# Patient Record
Sex: Female | Born: 1980 | Hispanic: No | Marital: Married | Smoking: Never smoker
Health system: Southern US, Community
[De-identification: ages and names within clinical notes are randomized; demographics above are authoritative.]

## PROBLEM LIST (undated history)

## (undated) DIAGNOSIS — K219 Gastro-esophageal reflux disease without esophagitis: Secondary | ICD-10-CM

## (undated) DIAGNOSIS — F32A Depression, unspecified: Secondary | ICD-10-CM

## (undated) DIAGNOSIS — F329 Major depressive disorder, single episode, unspecified: Secondary | ICD-10-CM

## (undated) DIAGNOSIS — K37 Unspecified appendicitis: Secondary | ICD-10-CM

## (undated) DIAGNOSIS — D649 Anemia, unspecified: Secondary | ICD-10-CM

## (undated) HISTORY — PX: APPENDECTOMY: SHX54

---

## 2011-01-31 ENCOUNTER — Other Ambulatory Visit: Payer: Self-pay | Admitting: Family Medicine

## 2011-01-31 DIAGNOSIS — Z3689 Encounter for other specified antenatal screening: Secondary | ICD-10-CM

## 2011-01-31 LAB — ABO/RH

## 2011-01-31 LAB — CBC
HCT: 33 % — AB (ref 36–46)
Hemoglobin: 11.6 g/dL — AB (ref 12.0–16.0)

## 2011-01-31 LAB — RPR: RPR: NONREACTIVE

## 2011-02-01 ENCOUNTER — Ambulatory Visit (HOSPITAL_COMMUNITY)
Admission: RE | Admit: 2011-02-01 | Discharge: 2011-02-01 | Disposition: A | Discharge status: Home / Self Care | Payer: Medicaid Other | Source: Ambulatory Visit | Attending: Family Medicine | Admitting: Family Medicine

## 2011-02-01 ENCOUNTER — Other Ambulatory Visit: Payer: Self-pay | Admitting: Family Medicine

## 2011-02-01 DIAGNOSIS — Z3689 Encounter for other specified antenatal screening: Secondary | ICD-10-CM

## 2011-02-01 DIAGNOSIS — O358XX0 Maternal care for other (suspected) fetal abnormality and damage, not applicable or unspecified: Secondary | ICD-10-CM | POA: Insufficient documentation

## 2011-02-01 DIAGNOSIS — Z363 Encounter for antenatal screening for malformations: Secondary | ICD-10-CM | POA: Insufficient documentation

## 2011-02-01 DIAGNOSIS — Z1389 Encounter for screening for other disorder: Secondary | ICD-10-CM | POA: Insufficient documentation

## 2011-02-22 ENCOUNTER — Other Ambulatory Visit: Payer: Self-pay | Admitting: Family Medicine

## 2011-02-26 ENCOUNTER — Ambulatory Visit (HOSPITAL_COMMUNITY)
Admission: RE | Admit: 2011-02-26 | Discharge: 2011-02-26 | Disposition: A | Discharge status: Home / Self Care | Payer: Medicaid Other | Source: Ambulatory Visit | Attending: Family Medicine | Admitting: Family Medicine

## 2011-02-26 DIAGNOSIS — O365990 Maternal care for other known or suspected poor fetal growth, unspecified trimester, not applicable or unspecified: Secondary | ICD-10-CM | POA: Insufficient documentation

## 2011-02-26 DIAGNOSIS — Z3689 Encounter for other specified antenatal screening: Secondary | ICD-10-CM | POA: Insufficient documentation

## 2011-05-17 ENCOUNTER — Other Ambulatory Visit: Payer: Self-pay | Admitting: Family Medicine

## 2011-05-17 DIAGNOSIS — R319 Hematuria, unspecified: Secondary | ICD-10-CM

## 2011-05-21 ENCOUNTER — Ambulatory Visit (HOSPITAL_COMMUNITY)
Admission: RE | Admit: 2011-05-21 | Discharge: 2011-05-21 | Disposition: A | Discharge status: Home / Self Care | Payer: Medicaid Other | Source: Ambulatory Visit | Attending: Family Medicine | Admitting: Family Medicine

## 2011-05-21 DIAGNOSIS — O99891 Other specified diseases and conditions complicating pregnancy: Secondary | ICD-10-CM | POA: Insufficient documentation

## 2011-05-21 DIAGNOSIS — R319 Hematuria, unspecified: Secondary | ICD-10-CM | POA: Insufficient documentation

## 2011-07-11 ENCOUNTER — Encounter (HOSPITAL_COMMUNITY): Payer: Self-pay | Admitting: *Deleted

## 2011-07-11 ENCOUNTER — Encounter (HOSPITAL_COMMUNITY): Payer: Self-pay | Admitting: Anesthesiology

## 2011-07-11 ENCOUNTER — Inpatient Hospital Stay (HOSPITAL_COMMUNITY): Payer: Medicaid Other | Admitting: Anesthesiology

## 2011-07-11 ENCOUNTER — Encounter (HOSPITAL_COMMUNITY): Payer: Self-pay

## 2011-07-11 ENCOUNTER — Inpatient Hospital Stay (HOSPITAL_COMMUNITY)
Admission: AD | Admit: 2011-07-11 | Discharge: 2011-07-15 | DRG: 766 | Disposition: A | Discharge status: Home / Self Care | Payer: Medicaid Other | Source: Ambulatory Visit | Attending: Obstetrics and Gynecology | Admitting: Obstetrics and Gynecology

## 2011-07-11 ENCOUNTER — Inpatient Hospital Stay (HOSPITAL_COMMUNITY)
Admission: AD | Admit: 2011-07-11 | Discharge: 2011-07-11 | Disposition: A | Discharge status: Home / Self Care | Payer: Medicaid Other | Source: Ambulatory Visit | Attending: Obstetrics and Gynecology | Admitting: Obstetrics and Gynecology

## 2011-07-11 DIAGNOSIS — O479 False labor, unspecified: Secondary | ICD-10-CM | POA: Insufficient documentation

## 2011-07-11 DIAGNOSIS — O4700 False labor before 37 completed weeks of gestation, unspecified trimester: Secondary | ICD-10-CM

## 2011-07-11 DIAGNOSIS — O34219 Maternal care for unspecified type scar from previous cesarean delivery: Secondary | ICD-10-CM | POA: Diagnosis present

## 2011-07-11 HISTORY — DX: Anemia, unspecified: D64.9

## 2011-07-11 HISTORY — DX: Major depressive disorder, single episode, unspecified: F32.9

## 2011-07-11 HISTORY — DX: Unspecified appendicitis: K37

## 2011-07-11 HISTORY — DX: Gastro-esophageal reflux disease without esophagitis: K21.9

## 2011-07-11 HISTORY — DX: Depression, unspecified: F32.A

## 2011-07-11 LAB — CBC
HCT: 38.8 % (ref 36.0–46.0)
Hemoglobin: 12.6 g/dL (ref 12.0–15.0)
RBC: 4.44 MIL/uL (ref 3.87–5.11)
WBC: 11.1 10*3/uL — ABNORMAL HIGH (ref 4.0–10.5)

## 2011-07-11 MED ORDER — EPHEDRINE 5 MG/ML INJ
10.0000 mg | INTRAVENOUS | Status: DC | PRN
  Filled 2011-07-11: qty 4

## 2011-07-11 MED ORDER — FENTANYL 2.5 MCG/ML BUPIVACAINE 1/10 % EPIDURAL INFUSION (WH - ANES)
INTRAMUSCULAR | Status: AC
  Filled 2011-07-11: qty 60

## 2011-07-11 MED ORDER — CITRIC ACID-SODIUM CITRATE 334-500 MG/5ML PO SOLN
30.0000 mL | ORAL | Status: DC | PRN
  Administered 2011-07-12: 30 mL via ORAL
  Filled 2011-07-11: qty 15

## 2011-07-11 MED ORDER — NALBUPHINE HCL 10 MG/ML IJ SOLN: 10.0000 mg | INTRAMUSCULAR | Status: DC | PRN

## 2011-07-11 MED ORDER — LACTATED RINGERS IV SOLN
500.0000 mL | INTRAVENOUS | Status: DC | PRN
  Administered 2011-07-11: 500 mL via INTRAVENOUS

## 2011-07-11 MED ORDER — ACETAMINOPHEN 325 MG PO TABS: 650.0000 mg | ORAL_TABLET | ORAL | Status: DC | PRN

## 2011-07-11 MED ORDER — OXYCODONE-ACETAMINOPHEN 5-325 MG PO TABS
2.0000 | ORAL_TABLET | ORAL | Status: DC | PRN
  Filled 2011-07-11 (×3): qty 2
  Filled 2011-07-11: qty 1
  Filled 2011-07-11: qty 2
  Filled 2011-07-11 (×2): qty 1
  Filled 2011-07-11 (×2): qty 2

## 2011-07-11 MED ORDER — LIDOCAINE HCL 1.5 % IJ SOLN
INTRAMUSCULAR | Status: DC | PRN
  Administered 2011-07-11 (×2): 5 mL via EPIDURAL

## 2011-07-11 MED ORDER — OXYTOCIN 20 UNITS IN LACTATED RINGERS INFUSION - SIMPLE: 125.0000 mL/h | INTRAVENOUS | Status: AC

## 2011-07-11 MED ORDER — ZOLPIDEM TARTRATE 10 MG PO TABS: 10.0000 mg | ORAL_TABLET | Freq: Every evening | ORAL | Status: DC | PRN

## 2011-07-11 MED ORDER — OXYTOCIN BOLUS FROM INFUSION
500.0000 mL | Freq: Once | INTRAVENOUS | Status: DC
  Filled 2011-07-11: qty 500
  Filled 2011-07-11: qty 1000

## 2011-07-11 MED ORDER — ONDANSETRON HCL 4 MG/2ML IJ SOLN: 4.0000 mg | Freq: Four times a day (QID) | INTRAMUSCULAR | Status: DC | PRN

## 2011-07-11 MED ORDER — FENTANYL 2.5 MCG/ML BUPIVACAINE 1/10 % EPIDURAL INFUSION (WH - ANES)
INTRAMUSCULAR | Status: DC | PRN
  Administered 2011-07-11: 14 mL/h via EPIDURAL

## 2011-07-11 MED ORDER — PHENYLEPHRINE 40 MCG/ML (10ML) SYRINGE FOR IV PUSH (FOR BLOOD PRESSURE SUPPORT)
PREFILLED_SYRINGE | INTRAVENOUS | Status: AC
  Filled 2011-07-11: qty 5

## 2011-07-11 MED ORDER — IBUPROFEN 600 MG PO TABS
600.0000 mg | ORAL_TABLET | Freq: Four times a day (QID) | ORAL | Status: DC | PRN
  Filled 2011-07-11 (×9): qty 1

## 2011-07-11 MED ORDER — LIDOCAINE HCL (PF) 1 % IJ SOLN
30.0000 mL | INTRAMUSCULAR | Status: DC | PRN
  Filled 2011-07-11 (×2): qty 30

## 2011-07-11 MED ORDER — NALBUPHINE SYRINGE 5 MG/0.5 ML
10.0000 mg | INJECTION | INTRAMUSCULAR | Status: DC | PRN
  Administered 2011-07-11: 10 mg via INTRAVENOUS
  Filled 2011-07-11 (×2): qty 1

## 2011-07-11 MED ORDER — EPHEDRINE 5 MG/ML INJ
10.0000 mg | INTRAVENOUS | Status: DC | PRN
  Filled 2011-07-11 (×2): qty 4

## 2011-07-11 MED ORDER — OXYTOCIN 20 UNITS IN LACTATED RINGERS INFUSION - SIMPLE
1.0000 m[IU]/min | INTRAVENOUS | Status: DC
  Administered 2011-07-11: 2 m[IU]/min via INTRAVENOUS

## 2011-07-11 MED ORDER — EPHEDRINE 5 MG/ML INJ
INTRAVENOUS | Status: AC
  Filled 2011-07-11: qty 4

## 2011-07-11 MED ORDER — FENTANYL 2.5 MCG/ML BUPIVACAINE 1/10 % EPIDURAL INFUSION (WH - ANES)
14.0000 mL/h | INTRAMUSCULAR | Status: DC
  Administered 2011-07-11 – 2011-07-12 (×2): 14 mL/h via EPIDURAL
  Filled 2011-07-11 (×2): qty 60

## 2011-07-11 MED ORDER — LACTATED RINGERS IV SOLN: 500.0000 mL | Freq: Once | INTRAVENOUS | Status: DC

## 2011-07-11 MED ORDER — DIPHENHYDRAMINE HCL 50 MG/ML IJ SOLN
12.5000 mg | INTRAMUSCULAR | Status: DC | PRN
  Filled 2011-07-11 (×2): qty 1

## 2011-07-11 MED ORDER — TERBUTALINE SULFATE 1 MG/ML IJ SOLN: 0.2500 mg | Freq: Once | INTRAMUSCULAR | Status: AC | PRN

## 2011-07-11 MED ORDER — PHENYLEPHRINE 40 MCG/ML (10ML) SYRINGE FOR IV PUSH (FOR BLOOD PRESSURE SUPPORT)
80.0000 ug | PREFILLED_SYRINGE | INTRAVENOUS | Status: DC | PRN
  Filled 2011-07-11: qty 5

## 2011-07-11 MED ORDER — FLEET ENEMA 7-19 GM/118ML RE ENEM: 1.0000 | ENEMA | RECTAL | Status: DC | PRN

## 2011-07-11 MED ORDER — LACTATED RINGERS IV SOLN
INTRAVENOUS | Status: DC
  Administered 2011-07-11 – 2011-07-12 (×4): via INTRAVENOUS

## 2011-07-11 NOTE — Anesthesia Preprocedure Evaluation (Signed)
Anesthesia Evaluation  Name, MR# and DOB Patient awake  General Assessment Comment  Reviewed: Allergy & Precautions, H&P , Patient's Chart, lab work & pertinent test results  Airway Mallampati: I TM Distance: >3 FB Neck ROM: full    Dental No notable dental hx.    Pulmonary  clear to auscultation  pulmonary exam normalPulmonary Exam Normal breath sounds clear to auscultation none    Cardiovascular     Neuro/Psych    (+) Depression,  Negative Neurological ROS    GI/Hepatic/Renal   negative Liver ROS  negative Renal ROS   GERD      Endo/Other  Negative Endocrine ROS (+)      Abdominal Normal abdominal exam  (+)   Musculoskeletal   Hematology negative hematology ROS (+)   Peds  Reproductive/Obstetrics (+) Pregnancy    Anesthesia Other Findings             Anesthesia Physical Anesthesia Plan  ASA: II  Anesthesia Plan: Epidural   Post-op Pain Management:    Induction:   Airway Management Planned:   Additional Equipment:   Intra-op Plan:   Post-operative Plan:   Informed Consent: I have reviewed the patients History and Physical, chart, labs and discussed the procedure including the risks, benefits and alternatives for the proposed anesthesia with the patient or authorized representative who has indicated his/her understanding and acceptance.     Plan Discussed with:   Anesthesia Plan Comments:         Anesthesia Quick Evaluation

## 2011-07-11 NOTE — Progress Notes (Signed)
Theresa Avery is a 30 y.o. G2P1001 at [redacted]w[redacted]d by LMP admitted for active labor  Subjective:   Objective: BP 93/58  Pulse 91  Temp(Src) 97.8 F (36.6 C) (Oral)  Resp 22  Ht 5' (1.524 m)  Wt 64.411 kg (142 lb)  BMI 27.73 kg/m2  SpO2 100%      FHT:  FHR: 130 bpm, variability: moderate,  accelerations:  Present,  decelerations:  Absent UC:   regular, every 6 minutes; slowed down after epidural and fluid bolus SVE:   8/70/-2.  AROM with light mec fluid  Labs: Lab Results  Component Value Date   WBC 11.1* 07/11/2011   HGB 12.6 07/11/2011   HCT 38.8 07/11/2011   MCV 87.4 07/11/2011   PLT 185 07/11/2011    Assessment / Plan: Spontaneous labor, progressing normally  Labor: Progressing normally Preeclampsia:   Fetal Wellbeing:  Category I Pain Control:  Epidural I/D:   Anticipated MOD:  NSVD  CRESENZO-DISHMAN,FRANCES 07/11/2011, 7:17 PM

## 2011-07-11 NOTE — Anesthesia Procedure Notes (Addendum)
Epidural Patient location during procedure: OB Start time: 07/11/2011 6:35 PM End time: 07/11/2011 6:44 PM Reason for block: procedure for pain  Staffing Anesthesiologist: Sandrea Hughs Performed by: anesthesiologist   Preanesthetic Checklist Completed: patient identified, site marked, surgical consent, pre-op evaluation, timeout performed, IV checked, risks and benefits discussed and monitors and equipment checked  Epidural Patient position: sitting Prep: site prepped and draped and DuraPrep Patient monitoring: continuous pulse ox and blood pressure Approach: midline Injection technique: LOR air  Needle:  Needle type: Tuohy  Needle gauge: 17 G Needle length: 9 cm Needle insertion depth: 4 cm Catheter type: closed end flexible Catheter size: 19 Gauge Catheter at skin depth: 9 cm Test dose: negative and 1.5% lidocaine  Assessment Events: blood not aspirated, injection not painful, no injection resistance, negative IV test and no paresthesia

## 2011-07-11 NOTE — Progress Notes (Signed)
Theresa Avery is a 30 y.o. G2P1001 at [redacted]w[redacted]d by ultrasound admitted for active labor  Subjective:   Objective: BP 86/58  Pulse 85  Temp(Src) 98.1 F (36.7 C) (Oral)  Resp 14  Ht 5' (1.524 m)  Wt 64.411 kg (142 lb)  BMI 27.73 kg/m2  SpO2 100%   I/O this shift: In: -  Out: 550 [Urine:550]  FHT:  FHR: 140 bpm, variability: moderate,  accelerations:  Present,  decelerations:  Present occ mild variable decel UC:   irregular, every 3-5  minutes SVE:   8/90/-1 by Joyce Copa, CNM  Labs: Lab Results  Component Value Date   WBC 11.1* 07/11/2011   HGB 12.6 07/11/2011   HCT 38.8 07/11/2011   MCV 87.4 07/11/2011   PLT 185 07/11/2011    Assessment / Plan: Protracted active phase; will continue to increase Pitocin.  IUPC placed  Labor: on 4 mu/min of Pitocin Preeclampsia:   Fetal Wellbeing:  Category II Pain Control:  Epidural I/D:   Anticipated MOD:  NSVD  CRESENZO-DISHMAN,FRANCES 07/11/2011, 10:43 PM

## 2011-07-11 NOTE — Progress Notes (Signed)
Pt states she had some black vaginal discharge and is having contractions every 10-15 minutes. Reports good fetal movement, no leaking.

## 2011-07-11 NOTE — Progress Notes (Signed)
  Theresa Avery is a 30 y.o. G2P1001 at [redacted]w[redacted]d admitted for active labor  Subjective: Pt resting comfortably.  Husband at bedside.  Low dose pitocin started on 2110 for inadequate contraction pattern.    Objective: BP 80/49  Pulse 83  Temp(Src) 98 F (36.7 C) (Oral)  Resp 14  Ht 5' (1.524 m)  Wt 142 lb (64.411 kg)  BMI 27.73 kg/m2  SpO2 100%      FHT:  FHR: 130 bpm, variability: moderate,  accelerations:  Present,  decelerations:  Present variables UC:   regular, every 3-4 minutes SVE:   Dilation: 8 Effacement (%): 80 Station: -2 Exam by:: Lilli Few, RN  Labs: Lab Results  Component Value Date   WBC 11.1* 07/11/2011   HGB 12.6 07/11/2011   HCT 38.8 07/11/2011   MCV 87.4 07/11/2011   PLT 185 07/11/2011    Assessment / Plan: Augmentation of labor, progressing well  Labor: started low dose pitocin; will increase cautiously as this is a TOLAC Fetal Wellbeing:  Category II Pain Control:  Epidural I/D:  n/a Anticipated MOD:  NSVD  [name], [name] 07/11/2011, 9:35 PM

## 2011-07-11 NOTE — ED Provider Notes (Signed)
History     Chief Complaint  Patient presents with   Contractions   HPI Comments: Pt. Presents today at [redacted]w[redacted]d with contractions and dark discharge from the vagina.  Contractions started earlier today and are irregular, between 8-15 minutes apart.  No LOF.  Fetal movement is present.   OB History    Grav Para Term Preterm Abortions TAB SAB Ect Mult Living   2 1 1  0 0 0 0 0 0 1      Past Medical History  Diagnosis Date   Malaria    Depression    Appendicitis     Did not have surgery    Past Surgical History  Procedure Date   Cesarean section     No family history on file.  History  Substance Use Topics   Smoking status: Never Smoker    Smokeless tobacco: Never Used   Alcohol Use: No    Allergies: No Known Allergies  Prescriptions prior to admission  Medication Sig Dispense Refill   amoxicillin (AMOXIL) 500 MG capsule Take 500 mg by mouth 3 (three) times daily. Patient had dental work done        prenatal vitamin w/FE, FA (PRENATAL 1 + 1) 27-1 MG TABS Take 1 tablet by mouth daily.          Review of Systems  Eyes: Negative for blurred vision.  Neurological: Negative for dizziness.   Physical Exam   Blood pressure 131/80, pulse 81, temperature 98.3 F (36.8 C), temperature source Oral, resp. rate 16, height 4' 9.5" (1.461 m), weight 69.491 kg (153 lb 3.2 oz), SpO2 99.00%.  Physical Exam  Constitutional: She appears well-developed and well-nourished.  Respiratory: Effort normal.  GI: Soft. She exhibits no distension.  Genitourinary: Vagina normal. No vaginal discharge found.       Cervix: 3/50/-2    MAU Course  Procedures  Assessment and Plan  D/C home with information about active labor. Instructed to return when active labor has begun. Pt. Was seen and discussed with Dr. Esmeralda Arthur Durland 07/11/2011, 9:13 AM

## 2011-07-11 NOTE — H&P (Signed)
Theresa Avery is a 30 y.o. female presenting for active labor at [redacted]w[redacted]d, no none ROM. Maternal Medical History:  Reason for admission: Reason for admission: contractions and vaginal bleeding.  Contractions: Onset was 13-24 hours ago.   Frequency: regular.   Perceived severity is strong.    Fetal activity: Perceived fetal activity is normal.      OB History    Grav Para Term Preterm Abortions TAB SAB Ect Mult Living   2 1 1  0 0 0 0 0 0 1     Past Medical History  Diagnosis Date   Malaria    Depression    Appendicitis     Did not have surgery   Past Surgical History  Procedure Date   Cesarean section    Family History: family history is not on file. Social History:  reports that she has never smoked. She has never used smokeless tobacco. She reports that she does not drink alcohol or use illicit drugs.  Review of Systems  Constitutional: Negative for fever.  Eyes: Negative for blurred vision.  Respiratory: Negative for shortness of breath.   Cardiovascular: Negative for chest pain.  Neurological: Negative for headaches.    Dilation: 7 Effacement (%): 80 Station: -1 Exam by:: Dr Natale Milch Blood pressure 125/85, pulse 98, temperature 97.8 F (36.6 C), temperature source Oral, resp. rate 22, SpO2 100.00%. Maternal Exam:  Uterine Assessment: Contraction duration is 30 seconds. Contraction frequency is regular.   Abdomen: Fetal presentation: vertex  Cervix: Cervix evaluated by digital exam.     Fetal Exam Fetal Monitor Review: Baseline rate: 140.  Variability: moderate (6-25 bpm).   Pattern: accelerations present and no decelerations.    Fetal State Assessment: Category I - tracings are normal.     Physical Exam  Constitutional: She is oriented to person, place, and time. She appears well-developed and well-nourished.  Cardiovascular: Normal rate, regular rhythm and normal heart sounds.   No murmur heard. Respiratory: Effort normal and breath sounds normal.  No respiratory distress. She has no wheezes.  Genitourinary:       Cervix: 7/80/-1  Neurological: She is alert and oriented to person, place, and time.    Prenatal labs: ABO, Rh: B/Positive/-- (03/21 0000) Antibody: Negative (03/21 0000) Rubella:  Nonimmune RPR: Nonreactive (03/21 0000)  HBsAg: Negative (03/21 0000)  HIV: Non-reactive (03/21 0000)  GBS: Negative (08/02 0000)   Assessment/Plan: Active labor Admit to L/D where vaginal delivery is expected. Previous c-section, has signed TOLAC consent. Pt. Would like an epidural upon request. Pt. Was discussed and seen with Dr. Natale Milch.  Katie Durland 07/11/2011, 4:59 PM

## 2011-07-11 NOTE — Progress Notes (Signed)
[employer] # [phone] for all education and admission questions.

## 2011-07-12 ENCOUNTER — Other Ambulatory Visit: Payer: Self-pay | Admitting: Obstetrics & Gynecology

## 2011-07-12 ENCOUNTER — Encounter (HOSPITAL_COMMUNITY): Payer: Self-pay | Admitting: *Deleted

## 2011-07-12 ENCOUNTER — Encounter (HOSPITAL_COMMUNITY)
Admission: AD | Disposition: A | Discharge status: Home / Self Care | Payer: Self-pay | Source: Ambulatory Visit | Attending: Obstetrics and Gynecology

## 2011-07-12 DIAGNOSIS — O34219 Maternal care for unspecified type scar from previous cesarean delivery: Secondary | ICD-10-CM

## 2011-07-12 SURGERY — Surgical Case
Anesthesia: Regional

## 2011-07-12 MED ORDER — MORPHINE SULFATE (PF) 0.5 MG/ML IJ SOLN
INTRAMUSCULAR | Status: DC | PRN
  Administered 2011-07-12: 2 mg via EPIDURAL

## 2011-07-12 MED ORDER — OXYTOCIN 20 UNITS IN LACTATED RINGERS INFUSION - SIMPLE
INTRAVENOUS | Status: DC | PRN
  Administered 2011-07-12: 40 [IU] via INTRAVENOUS

## 2011-07-12 MED ORDER — KETOROLAC TROMETHAMINE 60 MG/2ML IM SOLN
60.0000 mg | Freq: Once | INTRAMUSCULAR | Status: AC | PRN
  Administered 2011-07-12: 60 mg via INTRAMUSCULAR

## 2011-07-12 MED ORDER — SODIUM CHLORIDE 0.9 % IJ SOLN: 3.0000 mL | INTRAMUSCULAR | Status: DC | PRN

## 2011-07-12 MED ORDER — DIBUCAINE 1 % RE OINT: 1.0000 "application " | TOPICAL_OINTMENT | RECTAL | Status: DC | PRN

## 2011-07-12 MED ORDER — DIPHENHYDRAMINE HCL 50 MG/ML IJ SOLN
12.5000 mg | INTRAMUSCULAR | Status: DC | PRN
  Administered 2011-07-12: 12.5 mg via INTRAVENOUS

## 2011-07-12 MED ORDER — ONDANSETRON HCL 4 MG/2ML IJ SOLN
INTRAMUSCULAR | Status: AC
  Filled 2011-07-12: qty 2

## 2011-07-12 MED ORDER — SIMETHICONE 80 MG PO CHEW
80.0000 mg | CHEWABLE_TABLET | Freq: Three times a day (TID) | ORAL | Status: DC
  Administered 2011-07-12 – 2011-07-15 (×11): 80 mg via ORAL

## 2011-07-12 MED ORDER — METHYLERGONOVINE MALEATE 0.2 MG/ML IJ SOLN
INTRAMUSCULAR | Status: DC | PRN
  Administered 2011-07-12: 1 mL via INTRAMUSCULAR

## 2011-07-12 MED ORDER — ONDANSETRON HCL 4 MG/2ML IJ SOLN: 4.0000 mg | INTRAMUSCULAR | Status: DC | PRN

## 2011-07-12 MED ORDER — WITCH HAZEL-GLYCERIN EX PADS: 1.0000 "application " | MEDICATED_PAD | CUTANEOUS | Status: DC | PRN

## 2011-07-12 MED ORDER — IBUPROFEN 600 MG PO TABS: 600.0000 mg | ORAL_TABLET | Freq: Four times a day (QID) | ORAL | Status: DC | PRN

## 2011-07-12 MED ORDER — SCOPOLAMINE 1 MG/3DAYS TD PT72
1.0000 | MEDICATED_PATCH | Freq: Once | TRANSDERMAL | Status: AC
  Administered 2011-07-12: 1.5 mg via TRANSDERMAL

## 2011-07-12 MED ORDER — OXYTOCIN 10 UNIT/ML IJ SOLN
INTRAMUSCULAR | Status: AC
  Filled 2011-07-12: qty 2

## 2011-07-12 MED ORDER — IBUPROFEN 600 MG PO TABS
600.0000 mg | ORAL_TABLET | Freq: Four times a day (QID) | ORAL | Status: DC
  Administered 2011-07-12 – 2011-07-15 (×12): 600 mg via ORAL
  Filled 2011-07-12 (×2): qty 1

## 2011-07-12 MED ORDER — KETOROLAC TROMETHAMINE 30 MG/ML IJ SOLN: 15.0000 mg | Freq: Once | INTRAMUSCULAR | Status: DC | PRN

## 2011-07-12 MED ORDER — TETANUS-DIPHTH-ACELL PERTUSSIS 5-2.5-18.5 LF-MCG/0.5 IM SUSP
0.5000 mL | Freq: Once | INTRAMUSCULAR | Status: AC
  Administered 2011-07-13: 0.5 mL via INTRAMUSCULAR
  Filled 2011-07-12: qty 0.5

## 2011-07-12 MED ORDER — DIPHENHYDRAMINE HCL 25 MG PO CAPS: 25.0000 mg | ORAL_CAPSULE | Freq: Four times a day (QID) | ORAL | Status: DC | PRN

## 2011-07-12 MED ORDER — ONDANSETRON HCL 4 MG/2ML IJ SOLN: 4.0000 mg | Freq: Once | INTRAMUSCULAR | Status: DC | PRN

## 2011-07-12 MED ORDER — HYDROMORPHONE HCL 1 MG/ML IJ SOLN: 0.2500 mg | INTRAMUSCULAR | Status: DC | PRN

## 2011-07-12 MED ORDER — ZOLPIDEM TARTRATE 5 MG PO TABS: 5.0000 mg | ORAL_TABLET | Freq: Every evening | ORAL | Status: DC | PRN

## 2011-07-12 MED ORDER — NALBUPHINE HCL 10 MG/ML IJ SOLN
5.0000 mg | INTRAMUSCULAR | Status: DC | PRN
  Filled 2011-07-12: qty 1

## 2011-07-12 MED ORDER — SIMETHICONE 80 MG PO CHEW: 80.0000 mg | CHEWABLE_TABLET | ORAL | Status: DC | PRN

## 2011-07-12 MED ORDER — OXYTOCIN 20 UNITS IN LACTATED RINGERS INFUSION - SIMPLE
INTRAVENOUS | Status: AC
  Filled 2011-07-12: qty 1000

## 2011-07-12 MED ORDER — AMOXICILLIN 500 MG PO CAPS
500.0000 mg | ORAL_CAPSULE | Freq: Three times a day (TID) | ORAL | Status: DC
  Administered 2011-07-12 – 2011-07-15 (×10): 500 mg via ORAL
  Filled 2011-07-12 (×13): qty 1

## 2011-07-12 MED ORDER — SCOPOLAMINE 1 MG/3DAYS TD PT72
MEDICATED_PATCH | TRANSDERMAL | Status: AC
  Filled 2011-07-12: qty 1

## 2011-07-12 MED ORDER — MORPHINE SULFATE (PF) 0.5 MG/ML IJ SOLN
INTRAMUSCULAR | Status: DC | PRN
  Administered 2011-07-12: 3 mg via EPIDURAL

## 2011-07-12 MED ORDER — NALOXONE HCL 0.4 MG/ML IJ SOLN: 0.4000 mg | INTRAMUSCULAR | Status: DC | PRN

## 2011-07-12 MED ORDER — METHYLERGONOVINE MALEATE 0.2 MG/ML IJ SOLN: 0.2000 mg | INTRAMUSCULAR | Status: DC | PRN

## 2011-07-12 MED ORDER — OXYCODONE-ACETAMINOPHEN 5-325 MG PO TABS
1.0000 | ORAL_TABLET | ORAL | Status: DC | PRN
  Administered 2011-07-13 – 2011-07-15 (×5): 1 via ORAL

## 2011-07-12 MED ORDER — SENNOSIDES-DOCUSATE SODIUM 8.6-50 MG PO TABS
2.0000 | ORAL_TABLET | Freq: Every day | ORAL | Status: DC
  Administered 2011-07-12: 1 via ORAL
  Administered 2011-07-13 – 2011-07-14 (×2): 2 via ORAL

## 2011-07-12 MED ORDER — CEFAZOLIN SODIUM 1-5 GM-% IV SOLN
INTRAVENOUS | Status: AC
  Filled 2011-07-12: qty 50

## 2011-07-12 MED ORDER — MEPERIDINE HCL 25 MG/ML IJ SOLN: 6.2500 mg | INTRAMUSCULAR | Status: DC | PRN

## 2011-07-12 MED ORDER — MORPHINE SULFATE 0.5 MG/ML IJ SOLN
INTRAMUSCULAR | Status: AC
  Filled 2011-07-12: qty 10

## 2011-07-12 MED ORDER — SODIUM BICARBONATE 8.4 % IV SOLN
INTRAVENOUS | Status: DC | PRN
  Administered 2011-07-12: 10 mL via EPIDURAL

## 2011-07-12 MED ORDER — KETOROLAC TROMETHAMINE 30 MG/ML IJ SOLN: 30.0000 mg | Freq: Four times a day (QID) | INTRAMUSCULAR | Status: AC | PRN

## 2011-07-12 MED ORDER — MENTHOL 3 MG MT LOZG: 1.0000 | LOZENGE | OROMUCOSAL | Status: DC | PRN

## 2011-07-12 MED ORDER — ONDANSETRON HCL 4 MG/2ML IJ SOLN
INTRAMUSCULAR | Status: DC | PRN
  Administered 2011-07-12: 4 mg via INTRAVENOUS

## 2011-07-12 MED ORDER — CEFAZOLIN SODIUM 1-5 GM-% IV SOLN
INTRAVENOUS | Status: DC | PRN
  Administered 2011-07-12: 1 g via INTRAVENOUS

## 2011-07-12 MED ORDER — PHENYLEPHRINE 40 MCG/ML (10ML) SYRINGE FOR IV PUSH (FOR BLOOD PRESSURE SUPPORT)
PREFILLED_SYRINGE | INTRAVENOUS | Status: AC
  Filled 2011-07-12: qty 5

## 2011-07-12 MED ORDER — EPHEDRINE 5 MG/ML INJ
INTRAVENOUS | Status: AC
  Filled 2011-07-12: qty 10

## 2011-07-12 MED ORDER — DIPHENHYDRAMINE HCL 25 MG PO CAPS: 25.0000 mg | ORAL_CAPSULE | ORAL | Status: DC | PRN

## 2011-07-12 MED ORDER — SODIUM CHLORIDE 0.9 % IV SOLN
1.0000 ug/kg/h | INTRAVENOUS | Status: DC | PRN
  Filled 2011-07-12: qty 2.5

## 2011-07-12 MED ORDER — LANOLIN HYDROUS EX OINT: 1.0000 "application " | TOPICAL_OINTMENT | CUTANEOUS | Status: DC | PRN

## 2011-07-12 MED ORDER — DIPHENHYDRAMINE HCL 50 MG/ML IJ SOLN: 25.0000 mg | INTRAMUSCULAR | Status: DC | PRN

## 2011-07-12 MED ORDER — METHYLERGONOVINE MALEATE 0.2 MG PO TABS: 0.2000 mg | ORAL_TABLET | ORAL | Status: DC | PRN

## 2011-07-12 MED ORDER — LACTATED RINGERS IV SOLN: INTRAVENOUS | Status: DC

## 2011-07-12 MED ORDER — KETOROLAC TROMETHAMINE 60 MG/2ML IM SOLN
INTRAMUSCULAR | Status: AC
  Filled 2011-07-12: qty 2

## 2011-07-12 MED ORDER — ONDANSETRON HCL 4 MG/2ML IJ SOLN: 4.0000 mg | Freq: Three times a day (TID) | INTRAMUSCULAR | Status: DC | PRN

## 2011-07-12 MED ORDER — PHENYLEPHRINE HCL 10 MG/ML IJ SOLN
INTRAMUSCULAR | Status: DC | PRN
  Administered 2011-07-12 (×3): 40 ug via INTRAVENOUS

## 2011-07-12 MED ORDER — PRENATAL PLUS 27-1 MG PO TABS
1.0000 | ORAL_TABLET | Freq: Every day | ORAL | Status: DC
  Administered 2011-07-12 – 2011-07-15 (×4): 1 via ORAL
  Filled 2011-07-12 (×4): qty 1

## 2011-07-12 MED ORDER — METHYLERGONOVINE MALEATE 0.2 MG/ML IJ SOLN
INTRAMUSCULAR | Status: DC | PRN
  Administered 2011-07-12: 0.2 mg via INTRAMUSCULAR

## 2011-07-12 MED ORDER — ONDANSETRON HCL 4 MG PO TABS: 4.0000 mg | ORAL_TABLET | ORAL | Status: DC | PRN

## 2011-07-12 MED ORDER — KETOROLAC TROMETHAMINE 30 MG/ML IJ SOLN
30.0000 mg | Freq: Four times a day (QID) | INTRAMUSCULAR | Status: AC | PRN
  Administered 2011-07-12: 30 mg via INTRAVENOUS
  Filled 2011-07-12: qty 1

## 2011-07-12 MED ORDER — OXYTOCIN 20 UNITS IN LACTATED RINGERS INFUSION - SIMPLE
125.0000 mL/h | INTRAVENOUS | Status: AC
  Administered 2011-07-12: 125 mL/h via INTRAVENOUS

## 2011-07-12 SURGICAL SUPPLY — 29 items
CLOTH BEACON ORANGE TIMEOUT ST (SAFETY) ×2 IMPLANT
DERMABOND ADVANCED (GAUZE/BANDAGES/DRESSINGS) ×4 IMPLANT
DURAPREP 26ML APPLICATOR (WOUND CARE) ×4 IMPLANT
ELECT REM PT RETURN 9FT ADLT (ELECTROSURGICAL) ×2
ELECTRODE REM PT RTRN 9FT ADLT (ELECTROSURGICAL) ×1 IMPLANT
EXTRACTOR VACUUM BELL STYLE (SUCTIONS) ×2 IMPLANT
GLOVE BIOGEL PI IND STRL 8 (GLOVE) ×2 IMPLANT
GLOVE BIOGEL PI INDICATOR 8 (GLOVE) ×2
GLOVE ECLIPSE 8.0 STRL XLNG CF (GLOVE) ×4 IMPLANT
GOWN STRL REIN XL XLG (GOWN DISPOSABLE) ×4 IMPLANT
KIT ABG SYR 3ML LUER SLIP (SYRINGE) IMPLANT
NEEDLE HYPO 25X5/8 SAFETYGLIDE (NEEDLE) IMPLANT
NS IRRIG 1000ML POUR BTL (IV SOLUTION) ×2 IMPLANT
PACK C SECTION WH (CUSTOM PROCEDURE TRAY) ×2 IMPLANT
RTRCTR C-SECT PINK 25CM LRG (MISCELLANEOUS) ×2 IMPLANT
SLEEVE SCD COMPRESS KNEE MED (MISCELLANEOUS) ×2 IMPLANT
STAPLER VISISTAT 35W (STAPLE) IMPLANT
SUT CHROMIC 0 CT 1 (SUTURE) ×2 IMPLANT
SUT MNCRL 0 VIOLET CTX 36 (SUTURE) ×2 IMPLANT
SUT MONOCRYL 0 CTX 36 (SUTURE) ×2
SUT PLAIN 2 0 (SUTURE)
SUT PLAIN 2 0 XLH (SUTURE) IMPLANT
SUT PLAIN ABS 2-0 CT1 27XMFL (SUTURE) IMPLANT
SUT VIC AB 0 CTX 36 (SUTURE) ×1
SUT VIC AB 0 CTX36XBRD ANBCTRL (SUTURE) ×1 IMPLANT
SUT VIC AB 4-0 KS 27 (SUTURE) ×2 IMPLANT
TOWEL OR 17X24 6PK STRL BLUE (TOWEL DISPOSABLE) ×4 IMPLANT
TRAY FOLEY CATH 14FR (SET/KITS/TRAYS/PACK) IMPLANT
WATER STERILE IRR 1000ML POUR (IV SOLUTION) IMPLANT

## 2011-07-12 NOTE — Anesthesia Postprocedure Evaluation (Deleted)
Anesthesia Post Note  Patient: Theresa Avery  Procedure(s) Performed:  CESAREAN SECTION  Anesthesia type: Spinal  Patient location: PACU  Post pain: Pain level controlled  Post assessment: Post-op Vital signs reviewed  Last Vitals:  Filed Vitals:   07/12/11 0545  BP: 100/59  Pulse: 83  Temp:   Resp: 20    Post vital signs: Reviewed  Level of consciousness: awake  Complications: No apparent anesthesia complications

## 2011-07-12 NOTE — Anesthesia Postprocedure Evaluation (Signed)
Anesthesia Post Note  Patient: Theresa Avery  Procedure(s) Performed:  CESAREAN SECTION  Anesthesia type: Regional  Patient location: Mother/Baby  Post pain: Pain level controlled  Post assessment: Post-op Vital signs reviewed and Patient's Cardiovascular Status Stable  Last Vitals:  Filed Vitals:   07/12/11 0753  BP: 99/67  Pulse: 81  Temp: 99.2 F (37.3 C)  Resp: 20    Post vital signs: Reviewed and stable  Level of consciousness: awake, alert  and oriented  Complications: No apparent anesthesia complications

## 2011-07-12 NOTE — Addendum Note (Signed)
Addendum  created 07/12/11 0900 by Randa Spike   Modules edited:Notes Section

## 2011-07-12 NOTE — Progress Notes (Signed)
Theresa Avery is a 30 y.o. G2P1001 at [redacted]w[redacted]d by ultrasound admitted for active labor  Subjective:   Objective: BP 106/69  Pulse 85  Temp(Src) 98 F (36.7 C) (Oral)  Resp 16  Ht 5' (1.524 m)  Wt 64.411 kg (142 lb)  BMI 27.73 kg/m2  SpO2 100%   I/O this shift: In: -  Out: 550 [Urine:550]  FHT:  FHR: 140 bpm, variability: moderate,  accelerations:  Present,  decelerations:  Present mild variables on occasion UC:   MVU's ~ SVE:   8-9/thick on right side and anterior lip/-1  Labs: Lab Results  Component Value Date   WBC 11.1* 07/11/2011   HGB 12.6 07/11/2011   HCT 38.8 07/11/2011   MCV 87.4 07/11/2011   PLT 185 07/11/2011    Assessment / Plan: Protracted active phase  Labor: Dr. Despina Hidden notified of pt status.  WIll continue present management for now and recheck in 2 hours Preeclampsia:   Fetal Wellbeing:  Category II Pain Control:  Epidural I/D:   Anticipated MOD:  NSVD  CRESENZO-DISHMAN,FRANCES 07/12/2011, 1:40 AM

## 2011-07-12 NOTE — Transfer of Care (Signed)
  Anesthesia Post-op Note  Patient: Theresa Avery  Procedure(s) Performed:  CESAREAN SECTION  Patient Location: PACU  Anesthesia Type: Epidural  Level of Consciousness: awake, alert  and oriented  Airway and Oxygen Therapy: Patient Spontanous Breathing  Post-op Pain: none  Post-op Assessment: Post-op Vital signs reviewed and Patient's Cardiovascular Status Stable  Post-op Vital Signs: Reviewed and stable  Complications: No apparent anesthesia complications

## 2011-07-12 NOTE — Progress Notes (Signed)
[employer] on phone with pt and Drenda Freeze, [place] and Dr. Despina Hidden discussing need for C/S. Pt agrees and consents for C/S.

## 2011-07-12 NOTE — Progress Notes (Signed)
UR chart review completed.  

## 2011-07-12 NOTE — Progress Notes (Signed)
Theresa Avery is a 30 y.o. G2P1001 at [redacted]w[redacted]d admitted for active labor  Subjective: Comfortable with epidural.  Contractions at 180-190 mon[place] units.  Objective: BP 98/64  Pulse 81  Temp(Src) 98.1 F (36.7 C) (Oral)  Resp 14  Ht 5' (1.524 m)  Wt 142 lb (64.411 kg)  BMI 27.73 kg/m2  SpO2 100%   I/O this shift: In: -  Out: 550 [Urine:550]  FHT:  FHR: 135 bpm, variability: moderate,  accelerations:  Present,  decelerations:  Present occasional mild variables UC:   irregular, every 2-4 minutes SVE:   9/90/-1  Labs: Lab Results  Component Value Date   WBC 11.1* 07/11/2011   HGB 12.6 07/11/2011   HCT 38.8 07/11/2011   MCV 87.4 07/11/2011   PLT 185 07/11/2011    Assessment / Plan: Augmentation of labor, progressing well  Labor: continue on pitocin Fetal Wellbeing:  Category II Pain Control:  Epidural I/D:  n/a Anticipated MOD:  NSVD  [name], [name] 07/12/2011, 12:32 AM

## 2011-07-12 NOTE — Anesthesia Postprocedure Evaluation (Signed)
Anesthesia Post Note  Patient: Theresa Avery  Procedure(s) Performed:  CESAREAN SECTION  Anesthesia type: Epidural  Patient location: Mother/Baby  Post pain: Pain level controlled  Post assessment: Post-op Vital signs reviewed  Last Vitals:  Filed Vitals:   07/12/11 0615  BP: 92/62  Pulse: 85  Temp: 99.3 F (37.4 C)  Resp: 24    Post vital signs: Reviewed  Level of consciousness: awake  Complications: No apparent anesthesia complications

## 2011-07-12 NOTE — Op Note (Signed)
Preoperative diagnosis:  1.  Intrauterine pregnancy at 40 weeks 4 days gestation                                         2.  Previous low transverse caesarean section                                         3.  Patient desires vaginal trial of labor                                         4.  Arrested labor   Postoperative diagnosis:  Same as above plus midline fascial defect  Procedure:  Repeat cesarean section  Surgeon:  Lazaro Arms MD  Assistant:    Anesthesia:  Epidural  Findings:  Patient presented in labor at 7 cm, however, despite adequate labor never progressed past 8 cm.  She made no cervical change in 4 hours.    Over a low transverse incision was delivered a viable female at 59 with Apgars of 8 and 9 weighing pending. Uterus, tubes and ovaries were all normal.  There were no other significant findings  Description of operation:  Patient was taken to the operating room and placed in the supine position where she underwent dosing of her epidural anesthetic. She was then placed in the supine position with tilt to the left side. When adequate anesthetic level was obtained she was prepped and draped in usual sterile fashion and a Foley catheter was previously placed. A Pfannenstiel skin incision was made and carried down sharply to the rectus fascia which was scored in the midline extended laterally. The fascia was taken off the muscles both superiorly and without difficulty.  There was a midline fascial defect.   The muscles were divided.  The peritoneal cavity was entered.  Bladder blade was placed, no bladder flap was created.  Evidently the bladder had not been draining for some time due to the fetal vertex kinking it off, I assume.  A low transverse hysterotomy incision was made and delivered a viable female  infant at 3 with Apgars of 8 and 9 weighing pending in nursery.    The uterus was exteriorized. It was closed in 2 layers, the first being a running interlocking layer and the  second being an imbricating layer using 0 monocryl on a CTX needle. There was good resulting hemostasis. The uterus tubes and ovaries were all normal. Peritoneal cavity was irrigated vigorously. The muscles and peritoneum were reapproximated loosely. The fascia was closed using 0 Vicryl in running fashion and the previous fascial defect was closed.   Subcutaneous tissue was made hemostatic and irrigated. The skin was closed using 4-0 Vicryl on a Keith needle in a subcuticular fashion.  Dermabond was placed for additional wound integrity and to serve as a barrier. Blood loss for the procedure was 600 cc. The patient received a gram of Ancef prophylactically. The patient was taken to the recovery room in good stable condition with all counts being correct x3.  EBL 600 cc  Keyonte Cookston H 07/12/2011 5:03 AM

## 2011-07-12 NOTE — Progress Notes (Signed)
  Theresa Avery is a 29 y.o. G2P1001 at [redacted]w[redacted]d admitted for active labor  Subjective: Pt resting comfortably with epidural in place.  Objective: BP 88/50  Pulse 70  Temp(Src) 98 F (36.7 C) (Oral)  Resp 14  Ht 5' (1.524 m)  Wt 142 lb (64.411 kg)  BMI 27.73 kg/m2  SpO2 100%   I/O this shift: In: -  Out: 900 [Urine:900]  FHT:  FHR: 130 bpm, variability: moderate,  accelerations:  Present,  decelerations:  Absent UC:   regular, every 2 minutes SVE:   Dilation: 8.5 (thick anterior lip) Effacement (%): 90 Station: -1 Exam by:: Theresa Avery, CNM  Labs: Lab Results  Component Value Date   WBC 11.1* 07/11/2011   HGB 12.6 07/11/2011   HCT 38.8 07/11/2011   MCV 87.4 07/11/2011   PLT 185 07/11/2011    Assessment / Plan: Protracted active phase  Labor: no cervical change for last 4 hours with adequate contractions Fetal Wellbeing:  Category I Pain Control:  Epidural I/D:  n/a Anticipated MOD:  spoke with Dr. Despina Avery, plan to discuss c-section with pt and husband via phone interpreter  Avery, Theresa 07/12/2011, 3:28 AM

## 2011-07-12 NOTE — Plan of Care (Signed)
Problem: Phase I Progression Outcomes Goal: Adequate progression of labor Pt not progressing past 8-9 cm; going for a repeat C/S

## 2011-07-12 NOTE — Progress Notes (Signed)
Encounter addended by: Randa Spike on: 07/12/2011  9:01 AM<BR>     Documentation filed: Follow-up Section, Notes Section

## 2011-07-12 NOTE — Addendum Note (Signed)
Addendum  created 07/12/11 0900 by Randa Spike   Modules edited:Follow-up Section, Notes Section

## 2011-07-13 ENCOUNTER — Other Ambulatory Visit: Payer: Medicaid Other

## 2011-07-13 LAB — CBC
HCT: 24.3 % — ABNORMAL LOW (ref 36.0–46.0)
Hemoglobin: 8.1 g/dL — ABNORMAL LOW (ref 12.0–15.0)
MCH: 29.3 pg (ref 26.0–34.0)
MCHC: 33.3 g/dL (ref 30.0–36.0)
MCV: 88 fL (ref 78.0–100.0)
RDW: 15.2 % (ref 11.5–15.5)

## 2011-07-13 NOTE — Progress Notes (Signed)
Subjective: Postpartum Day 1: Cesarean Delivery for arrested labor Patient reports tolerating PO and + flatus.  Pt was taken off cath this am and has not voided yet.  No chest pain or shortness of breath.    Objective: Vital signs in last 24 hours: Temp:  [98.2 F (36.8 C)-99.2 F (37.3 C)] 98.2 F (36.8 C) (08/31 0600) Pulse Rate:  [77-92] 77  (08/31 0600) Resp:  [18-20] 20  (08/31 0600) BP: (79-99)/(46-67) 81/47 mmHg (08/31 0600) SpO2:  [96 %-99 %] 98 % (08/31 0200)  Physical Exam:  General: alert, cooperative and no distress Lochia: appropriate Uterine Fundus: firm, below umbilicus Abd: Soft, appropriately tender Lungs: CTA Heart: RRR Incision: healing well, no significant drainage, no dehiscence, no significant erythema DVT Evaluation: No evidence of DVT seen on physical exam. Negative Homan's sign. No cords or calf tenderness.   Basename 07/13/11 0510 07/11/11 1708  HGB 8.1* 12.6  HCT 24.3* 38.8    Assessment/Plan: Status post Cesarean section. Doing well postoperatively.  Continue care.    Katie Durland 07/13/2011, 7:36 AM
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
## 2011-07-14 NOTE — Progress Notes (Signed)
Subjective: Postpartum Day 2: Repeat Cesarean Delivery after failed VBAC attempt. Patient reports incisional pain and + flatus.  Has foley in place after in and out relieved and pt still unable to void on own yesterday.  Objective: Vital signs in last 24 hours: Temp:  [97.6 F (36.4 C)-99 F (37.2 C)] 97.6 F (36.4 C) (09/01 0640) Pulse Rate:  [71-87] 71  (09/01 0640) Resp:  [18] 18  (09/01 0640) BP: (78-86)/(45-54) 81/52 mmHg (09/01 0640) SpO2:  [99 %-100 %] 99 % (09/01 0640)  Physical Exam:  General: alert, cooperative, appears stated age and no distress Heart: RRR, no murmur Lungs: CTA B/L Lochia: appropriate Uterine Fundus: firm and at umbillicus Incision: healing well DVT Evaluation: No evidence of DVT seen on physical exam. DTRs 2+ B/L  Basename 07/13/11 0510 07/11/11 1708  HGB 8.1* 12.6  HCT 24.3* 38.8    Assessment/Plan: Status post Cesarean section. Postoperative course complicated by inability to void after foley removal.  Continue current care. Will d/c foley and if patient is unable to void on her own, will have nursing staff notify on call.  Anticipate d/c tomorrow. Pt is breastfeeding well, and wishes for "sticker" for birth contron (? Patch).  HOLBROOK,SUZANNA N 07/14/2011, 7:31 AM

## 2011-07-15 MED ORDER — IBUPROFEN 600 MG PO TABS: 600.0000 mg | ORAL_TABLET | Freq: Four times a day (QID) | ORAL | Status: AC

## 2011-07-15 MED ORDER — OXYCODONE-ACETAMINOPHEN 5-325 MG PO TABS: 2.0000 | ORAL_TABLET | ORAL | Status: AC | PRN

## 2011-07-15 MED ORDER — MEASLES, MUMPS & RUBELLA VAC ~~LOC~~ INJ
0.5000 mL | INJECTION | Freq: Once | SUBCUTANEOUS | Status: AC
  Administered 2011-07-15: 0.5 mL via SUBCUTANEOUS
  Filled 2011-07-15: qty 0.5

## 2011-07-15 NOTE — Discharge Summary (Signed)
Obstetric Discharge Summary Reason for Admission: cesarean section Prenatal Procedures: ultrasound Intrapartum Procedures: cesarean: low cervical, transverse Postpartum Procedures: none Complications-Operative and Postpartum: none Hemoglobin  Date Value Range Status  07/13/2011 8.1* 12.0-15.0 (g/dL) Final     DELTA CHECK NOTED     REPEATED TO VERIFY     HCT  Date Value Range Status  07/13/2011 24.3* 36.0-46.0 (%) Final    Discharge Diagnoses: Term Pregnancy-delivered  Discharge Information: Date: 07/15/2011 Activity: pelvic rest Diet: routine Medications: PNV, Ibuprophen and Viocodin Condition: stable and improved Instructions: refer to practice specific booklet Discharge to: home   Newborn Data: Live born female  Birth Weight: 8 lb 0.6 oz (3645 g) APGAR: 8, 9  Home with mother.  [name] 07/15/2011, 10:02 AM

## 2011-07-15 NOTE — Progress Notes (Signed)
Subjective: Postpartum Day 3: Cesarean Delivery Patient reports incisional pain, tolerating PO, + flatus and no problems voiding.    Objective: Vital signs in last 24 hours: Temp:  [97.7 F (36.5 C)-98.1 F (36.7 C)] 97.7 F (36.5 C) (09/02 0545) Pulse Rate:  [67-81] 67  (09/02 0545) Resp:  [18-19] 18  (09/02 0545) BP: (78-101)/(46-65) 78/46 mmHg (09/02 0545)  Physical Exam:  General: alert, cooperative, appears stated age and no distress Lochia: appropriate Uterine Fundus: firm Incision: healing well, no significant drainage, no dehiscence, no significant erythema DVT Evaluation: No evidence of DVT seen on physical exam. Negative Homan's sign. No cords or calf tenderness. No significant calf/ankle edema.   Basename 07/13/11 0510  HGB 8.1*  HCT 24.3*    Assessment/Plan: Status post Cesarean section. Doing well postoperatively.  Discharge home with standard precautions and return to clinic in 4-6 weeks.  Theresa Avery 07/15/2011, 9:46 AM

## 2011-07-15 NOTE — Progress Notes (Signed)
Intnatl interperter used [id number] for discharge instructions and education. Jamesetta Orleans RN

## 2011-07-18 ENCOUNTER — Inpatient Hospital Stay (HOSPITAL_COMMUNITY): Admission: RE | Admit: 2011-07-18 | Payer: Medicaid Other | Source: Ambulatory Visit

## 2011-07-24 ENCOUNTER — Encounter (HOSPITAL_COMMUNITY): Payer: Self-pay | Admitting: Obstetrics & Gynecology

## 2011-07-26 NOTE — ED Provider Notes (Signed)
Agree with above note.  Theresa Avery 07/26/2011 8:55 AM   

## 2011-08-23 NOTE — Progress Notes (Signed)
Agree with above note.  Theresa Avery 08/23/2011 10:56 AM

## 2014-09-13 ENCOUNTER — Encounter (HOSPITAL_COMMUNITY): Payer: Self-pay | Admitting: Obstetrics & Gynecology

## 2016-03-30 ENCOUNTER — Other Ambulatory Visit: Payer: Self-pay | Admitting: Infectious Disease

## 2016-03-30 ENCOUNTER — Ambulatory Visit
Admission: RE | Admit: 2016-03-30 | Discharge: 2016-03-30 | Disposition: A | Discharge status: Home / Self Care | Payer: No Typology Code available for payment source | Source: Ambulatory Visit | Attending: Infectious Disease | Admitting: Infectious Disease

## 2016-03-30 DIAGNOSIS — R7611 Nonspecific reaction to tuberculin skin test without active tuberculosis: Secondary | ICD-10-CM

## 2017-05-16 ENCOUNTER — Other Ambulatory Visit (HOSPITAL_COMMUNITY): Payer: Self-pay | Admitting: Nurse Practitioner

## 2017-05-16 DIAGNOSIS — O09512 Supervision of elderly primigravida, second trimester: Secondary | ICD-10-CM

## 2017-05-16 DIAGNOSIS — Z36 Encounter for antenatal screening for chromosomal anomalies: Secondary | ICD-10-CM

## 2017-05-16 LAB — OB RESULTS CONSOLE RPR: RPR: NONREACTIVE

## 2017-05-16 LAB — OB RESULTS CONSOLE VARICELLA ZOSTER ANTIBODY, IGG: VARICELLA IGG: IMMUNE

## 2017-05-16 LAB — OB RESULTS CONSOLE ABO/RH: RH Type: POSITIVE

## 2017-05-16 LAB — OB RESULTS CONSOLE RUBELLA ANTIBODY, IGM: RUBELLA: IMMUNE

## 2017-05-16 LAB — CYSTIC FIBROSIS DIAGNOSTIC STUDY: INTERPRETATION-CFDNA: NEGATIVE

## 2017-05-16 LAB — CYTOLOGY - PAP: Pap: NEGATIVE

## 2017-05-16 LAB — SICKLE CELL SCREEN: SICKLE CELL SCREEN: NORMAL

## 2017-05-16 LAB — OB RESULTS CONSOLE HGB/HCT, BLOOD
HEMATOCRIT: 35
Hemoglobin: 11.6

## 2017-05-16 LAB — OB RESULTS CONSOLE PLATELET COUNT: Platelets: 202

## 2017-05-16 LAB — OB RESULTS CONSOLE GC/CHLAMYDIA
CHLAMYDIA, DNA PROBE: NEGATIVE
Gonorrhea: NEGATIVE

## 2017-05-16 LAB — OB RESULTS CONSOLE HIV ANTIBODY (ROUTINE TESTING): HIV: NONREACTIVE

## 2017-05-16 LAB — CULTURE, OB URINE: Urine Culture, OB: NO GROWTH

## 2017-05-16 LAB — OB RESULTS CONSOLE HEPATITIS B SURFACE ANTIGEN: Hepatitis B Surface Ag: NEGATIVE

## 2017-05-16 LAB — OB RESULTS CONSOLE ANTIBODY SCREEN: Antibody Screen: NEGATIVE

## 2017-05-23 ENCOUNTER — Encounter (HOSPITAL_COMMUNITY): Payer: Self-pay | Admitting: *Deleted

## 2017-05-28 ENCOUNTER — Encounter (HOSPITAL_COMMUNITY): Payer: Self-pay

## 2017-05-28 ENCOUNTER — Other Ambulatory Visit (HOSPITAL_COMMUNITY): Payer: Self-pay | Admitting: *Deleted

## 2017-05-28 ENCOUNTER — Other Ambulatory Visit (HOSPITAL_COMMUNITY): Payer: Self-pay | Admitting: Nurse Practitioner

## 2017-05-28 ENCOUNTER — Ambulatory Visit (HOSPITAL_COMMUNITY)
Admission: RE | Admit: 2017-05-28 | Discharge: 2017-05-28 | Disposition: A | Discharge status: Home / Self Care | Payer: Medicaid Other | Source: Ambulatory Visit | Attending: Nurse Practitioner | Admitting: Nurse Practitioner

## 2017-05-28 DIAGNOSIS — Z36 Encounter for antenatal screening for chromosomal anomalies: Secondary | ICD-10-CM

## 2017-05-28 DIAGNOSIS — O09522 Supervision of elderly multigravida, second trimester: Secondary | ICD-10-CM | POA: Insufficient documentation

## 2017-05-28 DIAGNOSIS — IMO0002 Reserved for concepts with insufficient information to code with codable children: Secondary | ICD-10-CM

## 2017-05-28 DIAGNOSIS — Z3A19 19 weeks gestation of pregnancy: Secondary | ICD-10-CM

## 2017-05-28 DIAGNOSIS — O34211 Maternal care for low transverse scar from previous cesarean delivery: Secondary | ICD-10-CM | POA: Diagnosis not present

## 2017-05-28 DIAGNOSIS — O09529 Supervision of elderly multigravida, unspecified trimester: Secondary | ICD-10-CM

## 2017-05-28 DIAGNOSIS — Z315 Encounter for genetic counseling: Secondary | ICD-10-CM | POA: Insufficient documentation

## 2017-05-28 DIAGNOSIS — Z0489 Encounter for examination and observation for other specified reasons: Secondary | ICD-10-CM

## 2017-05-28 DIAGNOSIS — Z3A17 17 weeks gestation of pregnancy: Secondary | ICD-10-CM | POA: Diagnosis not present

## 2017-05-28 DIAGNOSIS — O09512 Supervision of elderly primigravida, second trimester: Secondary | ICD-10-CM

## 2017-05-28 NOTE — Progress Notes (Signed)
Genetic Counseling  High-Risk Gestation Note  Appointment Date:  05/28/2017 Referred By: Trina AoBaker, Sandra K, NP Date of Birth:  06-02-1981   Pregnancy History: [id number] Estimated Date of Delivery: 11/01/17 Estimated Gestational Age: 5311w4d Attending: Eulis FosterKristen Quinn, MD  Theresa Avery was seen for genetic counseling because of a maternal age of 36 y.o..   Burmese/English interpreter, Win, from [employer] provided interpretation for today's visit.   In summary:  Discussed AMA and associated risk for fetal aneuploidy  Reviewed previous screening  Quad screen - within normal limits [facility]  Ultrasound performed today - choroid plexus cyst visualized; see separate report  Reviewed that [facility] would not be expected to significantly increase risk for trisomy 18 above the patient's Quad screen result  Discussed options for screening  NIPS- declined  Discussed diagnostic testing options  Amniocentesis- declined  Reviewed family history concerns  She was counseled regarding maternal age and the association with risk for chromosome conditions due to nondisjunction with aging of the ova.  We reviewed chromosomes, nondisjunction, and the associated 1 in 3788 risk for fetal aneuploidy related to a maternal age of 36 y.o. at 2011w4d gestation.  She was counseled that the risk for aneuploidy decreases as gestational age increases, accounting for those pregnancies which spontaneously abort.  We specifically discussed Down syndrome (trisomy 5621), trisomies 1113 and 5918, and sex chromosome aneuploidies (47,XXX and 47,XXY) including the common features and prognoses of each.   Theresa Avery [facility], which initially was screen positive for Down syndrome. However, on today's ultrasound exam, the pregnancy was visualized to be 5611w4d, thus changing the Estimated Date of Delivery to  11/01/17. The patient's Quad screen was recalculated with the corrected due date and was within normal range for the conditions screened. We discussed that screening tests are used to modify a patient's a priori risk for aneuploidy, typically based on age. This estimated provides a pregnancy specific risk assessment. We reviewed sensitivities of Quad screen. She is aware that Quad screen does not assess for all chromosome conditions and is not diagnostic. We reviewed the Quad screen results and the associated reduction in risks for fetal Down syndrome (1 in 872), trisomy 18 (< 1 in 10,000), and ONTDs.   Detailed ultrasound was performed today, and a small choroid plexus cyst was visualized. No additional markers of fetal aneuploidy were visualized today. Complete ultrasound results under separate cover. We reviewed the benefits and limitations of ultrasound as a screening tool for fetal aneuploidy. She was counseled that the choroid plexus is an area in the brain where cerebral spinal fluid, the fluid that bathes the brain and spinal cord, is made.  Cysts, or fluid filled sacs, are sometimes found in the choroid plexus of babies both before and after they are born.  We discussed that approximately 1% of pregnancies evaluated by ultrasound will show choroid plexus cyst (CPCs).  Literature suggests that CPCs are an ultrasound finding in approximately 30-50% of fetuses with trisomy 18, but are an isolated finding in less than 10% of fetuses with trisomy 6518.  Theresa Avery was counseled that when a patient has other risk factors for fetal trisomy 7518 (abnormal First trimester or quad screening, advanced maternal age, or another ultrasound finding), CPCs are associated with an increased risk (LR of 9) for trisomy 6618.  Newer literature suggests that in the absence of other risk factors, CPCs are likely a normal variation  of development or a benign finding.  CPCs are not associated with an increased risk for fetal Down  syndrome. Considering her normal Quad screen result, Theresa Avery's risk for fetal trisomy 18 is not expected to be increased above her screen adjusted risk.   We reviewed the additional available screening option of noninvasive prenatal screening (NIPS)/cell free DNA (cfDNA) screening.  We reviewed the benefits and limitations of each option. Specifically, we discussed the conditions for which each test screens, the detection rates, and false positive rates of each. She was also counseled regarding diagnostic testing via amniocentesis. We reviewed the approximate 1 in 300-500 risk for complications from amniocentesis, including spontaneous pregnancy loss. We discussed the possible results that the tests might provide including: positive, negative, unanticipated, and no result. Finally, they were counseled regarding the cost of each option and potential out of pocket expenses. After consideration of all the options, she declined NIPS and amniocentesis.  She understands that screening tests cannot rule out all birth defects or genetic syndromes. The patient was advised of this limitation and states she still does not want additional testing at this time.   Theresa Avery ZOXWRU provided with information regarding cystic fibrosis (CF), spinal muscular atrophy (SMA) and hemoglobinopathies including the carrier frequency, availability of carrier screening and prenatal diagnosis if indicated.  In addition, we discussed that CF and hemoglobinopathies are routinely screened for as part of the [place] newborn screening panel. CF carrier screening and hemoglobin electrophoresis were drawn through her OB provider and are currently pending. She declined screening for SMA.  Both family histories were reviewed and found to be noncontributory for birth defects, intellectual disability, and known genetic conditions. Consanguinity was denied. Without further information regarding the provided family history, an accurate genetic risk  cannot be calculated. Further genetic counseling is warranted if more information is obtained.  Ms. Shuntavia Yerby denied exposure to environmental toxins or chemical agents. She denied the use of alcohol, tobacco or street drugs. She denied significant viral illnesses during the course of her pregnancy. Her medical and surgical histories were noncontributory.   I counseled Ms. [facility] regarding the above risks and available options.  The approximate face-to-face time with the genetic counselor was 35 minutes.  Quinn Plowman, MS,  Certified Genetic Counselor 05/28/2017

## 2017-05-29 ENCOUNTER — Other Ambulatory Visit (HOSPITAL_COMMUNITY): Payer: Self-pay

## 2017-06-25 ENCOUNTER — Ambulatory Visit (HOSPITAL_COMMUNITY)
Admission: RE | Admit: 2017-06-25 | Discharge: 2017-06-25 | Disposition: A | Discharge status: Home / Self Care | Payer: Medicaid Other | Source: Ambulatory Visit | Attending: Nurse Practitioner | Admitting: Nurse Practitioner

## 2017-06-25 ENCOUNTER — Encounter (HOSPITAL_COMMUNITY): Payer: Self-pay

## 2017-06-25 DIAGNOSIS — Z3A21 21 weeks gestation of pregnancy: Secondary | ICD-10-CM | POA: Insufficient documentation

## 2017-06-25 DIAGNOSIS — O09522 Supervision of elderly multigravida, second trimester: Secondary | ICD-10-CM | POA: Diagnosis not present

## 2017-06-25 DIAGNOSIS — O34219 Maternal care for unspecified type scar from previous cesarean delivery: Secondary | ICD-10-CM | POA: Insufficient documentation

## 2017-06-25 DIAGNOSIS — Z3689 Encounter for other specified antenatal screening: Secondary | ICD-10-CM | POA: Diagnosis not present

## 2017-06-25 DIAGNOSIS — IMO0002 Reserved for concepts with insufficient information to code with codable children: Secondary | ICD-10-CM

## 2017-06-25 DIAGNOSIS — Z0489 Encounter for examination and observation for other specified reasons: Secondary | ICD-10-CM

## 2017-06-26 ENCOUNTER — Other Ambulatory Visit (HOSPITAL_COMMUNITY): Payer: Self-pay | Admitting: *Deleted

## 2017-06-26 DIAGNOSIS — O43129 Velamentous insertion of umbilical cord, unspecified trimester: Secondary | ICD-10-CM

## 2017-07-23 ENCOUNTER — Other Ambulatory Visit (HOSPITAL_COMMUNITY): Payer: Self-pay | Admitting: Obstetrics and Gynecology

## 2017-07-23 ENCOUNTER — Other Ambulatory Visit (HOSPITAL_COMMUNITY): Payer: Self-pay | Admitting: *Deleted

## 2017-07-23 ENCOUNTER — Ambulatory Visit (HOSPITAL_COMMUNITY)
Admission: RE | Admit: 2017-07-23 | Discharge: 2017-07-23 | Disposition: A | Discharge status: Home / Self Care | Payer: Medicaid Other | Source: Ambulatory Visit | Attending: Nurse Practitioner | Admitting: Nurse Practitioner

## 2017-07-23 ENCOUNTER — Encounter (HOSPITAL_COMMUNITY): Payer: Self-pay

## 2017-07-23 DIAGNOSIS — Z3A25 25 weeks gestation of pregnancy: Secondary | ICD-10-CM | POA: Insufficient documentation

## 2017-07-23 DIAGNOSIS — Z0489 Encounter for examination and observation for other specified reasons: Secondary | ICD-10-CM

## 2017-07-23 DIAGNOSIS — O09522 Supervision of elderly multigravida, second trimester: Secondary | ICD-10-CM | POA: Diagnosis not present

## 2017-07-23 DIAGNOSIS — O43122 Velamentous insertion of umbilical cord, second trimester: Secondary | ICD-10-CM | POA: Diagnosis not present

## 2017-07-23 DIAGNOSIS — O34219 Maternal care for unspecified type scar from previous cesarean delivery: Secondary | ICD-10-CM | POA: Insufficient documentation

## 2017-07-23 DIAGNOSIS — O43129 Velamentous insertion of umbilical cord, unspecified trimester: Secondary | ICD-10-CM

## 2017-07-23 DIAGNOSIS — IMO0002 Reserved for concepts with insufficient information to code with codable children: Secondary | ICD-10-CM

## 2017-07-23 DIAGNOSIS — Z362 Encounter for other antenatal screening follow-up: Secondary | ICD-10-CM | POA: Insufficient documentation

## 2017-09-03 ENCOUNTER — Encounter (HOSPITAL_COMMUNITY): Payer: Self-pay

## 2017-09-03 ENCOUNTER — Ambulatory Visit (HOSPITAL_COMMUNITY)
Admission: RE | Admit: 2017-09-03 | Discharge: 2017-09-03 | Disposition: A | Discharge status: Home / Self Care | Payer: Medicaid Other | Source: Ambulatory Visit | Attending: Nurse Practitioner | Admitting: Nurse Practitioner

## 2017-09-03 DIAGNOSIS — Z3A31 31 weeks gestation of pregnancy: Secondary | ICD-10-CM | POA: Insufficient documentation

## 2017-09-03 DIAGNOSIS — O43129 Velamentous insertion of umbilical cord, unspecified trimester: Secondary | ICD-10-CM | POA: Diagnosis not present

## 2017-09-03 DIAGNOSIS — O34219 Maternal care for unspecified type scar from previous cesarean delivery: Secondary | ICD-10-CM | POA: Diagnosis not present

## 2017-09-03 DIAGNOSIS — O09523 Supervision of elderly multigravida, third trimester: Secondary | ICD-10-CM | POA: Insufficient documentation

## 2017-09-04 LAB — OB RESULTS CONSOLE HGB/HCT, BLOOD
HEMATOCRIT: 34
HEMOGLOBIN: 11.5

## 2017-09-04 LAB — OB RESULTS CONSOLE RPR: RPR: NONREACTIVE

## 2017-09-12 ENCOUNTER — Encounter: Payer: Self-pay | Admitting: *Deleted

## 2017-09-19 ENCOUNTER — Encounter (HOSPITAL_COMMUNITY): Payer: Self-pay

## 2017-09-19 ENCOUNTER — Encounter: Payer: Self-pay | Admitting: Family Medicine

## 2017-09-19 ENCOUNTER — Ambulatory Visit (INDEPENDENT_AMBULATORY_CARE_PROVIDER_SITE_OTHER): Payer: Medicaid Other | Admitting: Family Medicine

## 2017-09-19 VITALS — BP 112/76 | HR 90 | Wt 165.0 lb

## 2017-09-19 DIAGNOSIS — O43123 Velamentous insertion of umbilical cord, third trimester: Secondary | ICD-10-CM

## 2017-09-19 DIAGNOSIS — O34219 Maternal care for unspecified type scar from previous cesarean delivery: Secondary | ICD-10-CM

## 2017-09-19 DIAGNOSIS — O43129 Velamentous insertion of umbilical cord, unspecified trimester: Secondary | ICD-10-CM | POA: Insufficient documentation

## 2017-09-19 DIAGNOSIS — O24419 Gestational diabetes mellitus in pregnancy, unspecified control: Secondary | ICD-10-CM | POA: Insufficient documentation

## 2017-09-19 DIAGNOSIS — O0993 Supervision of high risk pregnancy, unspecified, third trimester: Secondary | ICD-10-CM

## 2017-09-19 DIAGNOSIS — O0990 Supervision of high risk pregnancy, unspecified, unspecified trimester: Secondary | ICD-10-CM | POA: Insufficient documentation

## 2017-09-19 LAB — POCT URINALYSIS DIP (DEVICE)
BILIRUBIN URINE: NEGATIVE
GLUCOSE, UA: NEGATIVE mg/dL
HGB URINE DIPSTICK: NEGATIVE
Nitrite: NEGATIVE
Protein, ur: NEGATIVE mg/dL
SPECIFIC GRAVITY, URINE: 1.015 (ref 1.005–1.030)
Urobilinogen, UA: 0.2 mg/dL (ref 0.0–1.0)
pH: 7 (ref 5.0–8.0)

## 2017-09-19 MED ORDER — ACCU-CHEK FASTCLIX LANCETS MISC: 1.0000 | Freq: Four times a day (QID) | 12 refills | Status: DC

## 2017-09-19 MED ORDER — GLUCOSE BLOOD VI STRP: ORAL_STRIP | 12 refills | Status: DC

## 2017-09-19 MED ORDER — ACCU-CHEK GUIDE W/DEVICE KIT: 1.0000 [IU] | PACK | Freq: Four times a day (QID) | 0 refills | Status: DC

## 2017-09-19 NOTE — Progress Notes (Signed)
New ob/28 wk packet given  [employer] Interpreter Leslie AndreaLay Mu  Home Medicaid Form Completed

## 2017-09-19 NOTE — Progress Notes (Signed)
   PRENATAL VISIT NOTE  Subjective:  Theresa Avery is a 36 y.o. G3P2002 at 79w6dbeing seen today [facility]  She is currently monitored for the following issues for this high-risk pregnancy and has Advanced maternal age in multigravida; Supervision of high risk pregnancy, antepartum; Gestational diabetes mellitus (GDM) affecting pregnancy, antepartum; Velamentous insertion of umbilical cord, antepartum; and Previous cesarean section complicating pregnancy, antepartum condition or complication on their problem list.  Patient reports no complaints.  Contractions: Not present. Vag. Bleeding: None.  Movement: Present. Denies leaking of fluid.   The following portions of the patient's history were reviewed and updated as appropriate: allergies, current medications, past family history, past medical history, past social history, past surgical history and problem list. Problem list updated.  Objective:   Vitals:   09/19/17 1321  BP: 112/76  Pulse: 90  Weight: 165 lb (74.8 kg)    Fetal Status: Fetal Heart Rate (bpm): 138 Fundal Height: 33 cm Movement: Present     General:  Alert, oriented and cooperative. Patient is in no acute distress.  Skin: Skin is warm and dry. No rash noted.   Cardiovascular: Normal heart rate noted  Respiratory: Normal respiratory effort, no problems with respiration noted  Abdomen: Soft, gravid, appropriate for gestational age.  Pain/Pressure: Present     Pelvic: Cervical exam deferred        Extremities: Normal range of motion.  Edema: None  Mental Status:  Normal mood and affect. Normal behavior. Normal judgment and thought content.   Assessment and Plan:  Pregnancy: G3P2002 at 323w6d1. Supervision of high risk pregnancy, antepartum [facility]  2. Gestational diabetes mellitus (GDM) affecting pregnancy, antepartum Has yet to see D & N Mgmt-->advised diet, carb avoidance,  protein, 3 meals, 3 snacks - ACCU-CHEK FASTCLIX LANCETS MISC; 1 each 4 (four) times daily by Percutaneous route.  Dispense: 100 each; Refill: 12 - Blood Glucose Monitoring Suppl (ACCU-CHEK GUIDE) w/Device KIT; 1 Units 4 (four) times daily by Does not apply route.  Dispense: 1 kit; Refill: 0 - glucose blood (ACCU-CHEK GUIDE) test strip; Use as instructed  Dispense: 50 each; Refill: 12  3. Velamentous insertion of umbilical cord, antepartum nml recent growth  4. Previous cesarean section complicating pregnancy, antepartum condition or complication X2U0--2BXooked at 39 wks.  Preterm labor symptoms and general obstetric precautions including but not limited to vaginal bleeding, contractions, leaking of fluid and fetal movement were reviewed in detail with the patient. Please refer to After Visit Summary for other counseling recommendations.  Return in 1 week (on 09/26/2017) for needs MD, [facility]  AS[facility].   TaDonnamae JudeMD

## 2017-09-19 NOTE — Patient Instructions (Addendum)

## 2017-09-26 ENCOUNTER — Encounter: Payer: Medicaid Other | Attending: Family Medicine | Admitting: *Deleted

## 2017-09-26 ENCOUNTER — Ambulatory Visit: Payer: Medicaid Other | Admitting: *Deleted

## 2017-09-26 ENCOUNTER — Ambulatory Visit (INDEPENDENT_AMBULATORY_CARE_PROVIDER_SITE_OTHER): Payer: Medicaid Other | Admitting: Family Medicine

## 2017-09-26 VITALS — BP 103/77 | HR 84 | Wt 165.0 lb

## 2017-09-26 DIAGNOSIS — O34219 Maternal care for unspecified type scar from previous cesarean delivery: Secondary | ICD-10-CM | POA: Diagnosis not present

## 2017-09-26 DIAGNOSIS — Z713 Dietary counseling and surveillance: Secondary | ICD-10-CM | POA: Insufficient documentation

## 2017-09-26 DIAGNOSIS — O0990 Supervision of high risk pregnancy, unspecified, unspecified trimester: Secondary | ICD-10-CM | POA: Diagnosis not present

## 2017-09-26 DIAGNOSIS — R7302 Impaired glucose tolerance (oral): Secondary | ICD-10-CM | POA: Diagnosis not present

## 2017-09-26 DIAGNOSIS — O43129 Velamentous insertion of umbilical cord, unspecified trimester: Secondary | ICD-10-CM | POA: Diagnosis not present

## 2017-09-26 DIAGNOSIS — R7309 Other abnormal glucose: Secondary | ICD-10-CM

## 2017-09-26 DIAGNOSIS — O24419 Gestational diabetes mellitus in pregnancy, unspecified control: Secondary | ICD-10-CM

## 2017-09-26 DIAGNOSIS — O09523 Supervision of elderly multigravida, third trimester: Secondary | ICD-10-CM

## 2017-09-26 LAB — POCT URINALYSIS DIP (DEVICE)
BILIRUBIN URINE: NEGATIVE
Glucose, UA: NEGATIVE mg/dL
KETONES UR: NEGATIVE mg/dL
Leukocytes, UA: NEGATIVE
Nitrite: NEGATIVE
PH: 6 (ref 5.0–8.0)
Protein, ur: NEGATIVE mg/dL
SPECIFIC GRAVITY, URINE: 1.02 (ref 1.005–1.030)
Urobilinogen, UA: 0.2 mg/dL (ref 0.0–1.0)

## 2017-09-26 NOTE — Progress Notes (Signed)
  Patient was seen on 09/26/2017 for Gestational Diabetes self-management. Patient is here with her husband who speaks some [place] and a Burmese interpretor. Diet history obtained, eating habits appear appropriate but will follow up on portion of rice. She brought her new Accu Chek Meter with her today, she needs instruction.  The following learning objectives were met by the patient :   States the definition of Gestational Diabetes  States why dietary management is important in controlling blood glucose  Describes the effects of carbohydrates on blood glucose levels  Demonstrates ability to create a balanced meal plan  Demonstrates carbohydrate counting   States when to check blood glucose levels  Demonstrates proper blood glucose monitoring techniques  States the effect of stress and exercise on blood glucose levels  States the importance of limiting caffeine and abstaining from alcohol and smoking  Plan:  Aim for 3 Carb Choices per meal (45 grams) +/- 1 either way  Aim for 1-2 Carbs per snack Begin reading food labels for Total Carbohydrate of foods Consider  increasing your activity level by walking or other activity daily as tolerated Begin checking BG before breakfast and 2 hours after first bite of breakfast, lunch and dinner as directed by MD  Bring Log Book Sheet to every medical appointment   Take medication if directed by MD  Patient already has a meter: Accu Chek Guide Patient instructed to test pre breakfast and 2 hours after each meal as directed by MD  Patient instructed to monitor glucose levels: FBS: 60 - 95 mg/dl 2 hour: <120 mg/dl  Patient received the following handouts: in [place] per husband's request  Nutrition Diabetes and Pregnancy  Carbohydrate Counting List  Patient will be seen for follow-up as needed.

## 2017-09-26 NOTE — Patient Instructions (Signed)
Contraception Choices Contraception (birth control) is the use of any methods or devices to prevent pregnancy. Below are some methods to help avoid pregnancy. Hormonal methods  Contraceptive implant. This is a thin, plastic tube containing progesterone hormone. It does not contain estrogen hormone. Your health care provider inserts the tube in the inner part of the upper arm. The tube can remain in place for up to 3 years. After 3 years, the implant must be removed. The implant prevents the ovaries from releasing an egg (ovulation), thickens the cervical mucus to prevent sperm from entering the uterus, and thins the lining of the inside of the uterus.  Progesterone-only injections. These injections are given every 3 months by your health care provider to prevent pregnancy. This synthetic progesterone hormone stops the ovaries from releasing eggs. It also thickens cervical mucus and changes the uterine lining. This makes it harder for sperm to survive in the uterus.  Birth control pills. These pills contain estrogen and progesterone hormone. They work by preventing the ovaries from releasing eggs (ovulation). They also cause the cervical mucus to thicken, preventing the sperm from entering the uterus. Birth control pills are prescribed by a health care provider.Birth control pills can also be used to treat heavy periods.  Minipill. This type of birth control pill contains only the progesterone hormone. They are taken every day of each month and must be prescribed by your health care provider.  Birth control patch. The patch contains hormones similar to those in birth control pills. It must be changed once a week and is prescribed by a health care provider.  Vaginal ring. The ring contains hormones similar to those in birth control pills. It is left in the vagina for 3 weeks, removed for 1 week, and then a new one is put back in place. The patient must be comfortable inserting and removing the ring from  the vagina.A health care provider's prescription is necessary.  Emergency contraception. Emergency contraceptives prevent pregnancy after unprotected sexual intercourse. This pill can be taken right after sex or up to 5 days after unprotected sex. It is most effective the sooner you take the pills after having sexual intercourse. Most emergency contraceptive pills are available without a prescription. Check with your pharmacist. Do not use emergency contraception as your only form of birth control. Barrier methods  Female condom. This is a thin sheath (latex or rubber) that is worn over the penis during sexual intercourse. It can be used with spermicide to increase effectiveness.  Female condom. This is a soft, loose-fitting sheath that is put into the vagina before sexual intercourse.  Diaphragm. This is a soft, latex, dome-shaped barrier that must be fitted by a health care provider. It is inserted into the vagina, along with a spermicidal jelly. It is inserted before intercourse. The diaphragm should be left in the vagina for 6 to 8 hours after intercourse.  Cervical cap. This is a round, soft, latex or plastic cup that fits over the cervix and must be fitted by a health care provider. The cap can be left in place for up to 48 hours after intercourse.  Sponge. This is a soft, circular piece of polyurethane foam. The sponge has spermicide in it. It is inserted into the vagina after wetting it and before sexual intercourse.  Spermicides. These are chemicals that kill or block sperm from entering the cervix and uterus. They come in the form of creams, jellies, suppositories, foam, or tablets. They do not require a prescription. They   are inserted into the vagina with an applicator before having sexual intercourse. The process must be repeated every time you have sexual intercourse. Intrauterine contraception  Intrauterine device (IUD). This is a T-shaped device that is put in a woman's uterus during  a menstrual period to prevent pregnancy. There are 2 types: ? Copper IUD. This type of IUD is wrapped in copper wire and is placed inside the uterus. Copper makes the uterus and fallopian tubes produce a fluid that kills sperm. It can stay in place for 10 years. ? Hormone IUD. This type of IUD contains the hormone progestin (synthetic progesterone). The hormone thickens the cervical mucus and prevents sperm from entering the uterus, and it also thins the uterine lining to prevent implantation of a fertilized egg. The hormone can weaken or kill the sperm that get into the uterus. It can stay in place for 3-5 years, depending on which type of IUD is used. Permanent methods of contraception  Female tubal ligation. This is when the woman's fallopian tubes are surgically sealed, tied, or blocked to prevent the egg from traveling to the uterus.  Hysteroscopic sterilization. This involves placing a small coil or insert into each fallopian tube. Your doctor uses a technique called hysteroscopy to do the procedure. The device causes scar tissue to form. This results in permanent blockage of the fallopian tubes, so the sperm cannot fertilize the egg. It takes about 3 months after the procedure for the tubes to become blocked. You must use another form of birth control for these 3 months.  Female sterilization. This is when the female has the tubes that carry sperm tied off (vasectomy).This blocks sperm from entering the vagina during sexual intercourse. After the procedure, the man can still ejaculate fluid (semen). Natural planning methods  Natural family planning. This is not having sexual intercourse or using a barrier method (condom, diaphragm, cervical cap) on days the woman could become pregnant.  Calendar method. This is keeping track of the length of each menstrual cycle and identifying when you are fertile.  Ovulation method. This is avoiding sexual intercourse during ovulation.  Symptothermal method.  This is avoiding sexual intercourse during ovulation, using a thermometer and ovulation symptoms.  Post-ovulation method. This is timing sexual intercourse after you have ovulated. Regardless of which type or method of contraception you choose, it is important that you use condoms to protect against the transmission of sexually transmitted infections (STIs). Talk with your health care provider about which form of contraception is most appropriate for you. This information is not intended to replace advice given to you by your health care provider. Make sure you discuss any questions you have with your health care provider. Document Released: 10/29/2005 Document Revised: 04/05/2016 Document Reviewed: 04/23/2013 Elsevier Interactive Patient Education  2017 Elsevier Inc.  

## 2017-09-26 NOTE — Progress Notes (Signed)
   PRENATAL VISIT NOTE  Subjective:  Theresa Avery is a 36 y.o. G3P2002 at 6941w6d being seen today for ongoing prenatal care.  She is currently monitored for the following issues for this high-risk pregnancy and has Advanced maternal age in multigravida; Supervision of high risk pregnancy, antepartum; Gestational diabetes mellitus (GDM) affecting pregnancy, antepartum; Velamentous insertion of umbilical cord, antepartum; and Previous cesarean section complicating pregnancy, antepartum condition or complication on their problem list.  Patient reports no complaints.  Contractions: Not present. Vag. Bleeding: None.  Movement: Present. Denies leaking of fluid.   The following portions of the patient's history were reviewed and updated as appropriate: allergies, current medications, past family history, past medical history, past social history, past surgical history and problem list. Problem list updated.  Objective:   Vitals:   09/26/17 1413  BP: 103/77  Pulse: 84  Weight: 165 lb (74.8 kg)    Fetal Status: Fetal Heart Rate (bpm): 140 Fundal Height: 33 cm Movement: Present  Presentation: Vertex  General:  Alert, oriented and cooperative. Patient is in no acute distress.  Skin: Skin is warm and dry. No rash noted.   Cardiovascular: Normal heart rate noted  Respiratory: Normal respiratory effort, no problems with respiration noted  Abdomen: Soft, gravid, appropriate for gestational age.  Pain/Pressure: Absent     Pelvic: Cervical exam deferred        Extremities: Normal range of motion.  Edema: Trace  Mental Status:  Normal mood and affect. Normal behavior. Normal judgment and thought content.   Assessment and Plan:  Pregnancy: G3P2002 at 5841w6d  1. Elderly multigravida in third trimester   2. Supervision of high risk pregnancy, antepartum Continue prenatal care.   3. Gestational diabetes mellitus (GDM) affecting pregnancy, antepartum Saw D and N mgmt today--will begin checking BS  and return with them next week.  4. Velamentous insertion of umbilical cord, antepartum Recent nml growth  5. Previous cesarean section complicating pregnancy, antepartum condition or complication Booked for 39 wks.  Preterm labor symptoms and general obstetric precautions including but not limited to vaginal bleeding, contractions, leaking of fluid and fetal movement were reviewed in detail with the patient. Please refer to After Visit Summary for other counseling recommendations.  Return in 1 week (on 10/03/2017).   Reva Boresanya S Araminta Zorn, MD

## 2017-09-27 ENCOUNTER — Encounter (HOSPITAL_COMMUNITY): Payer: Self-pay

## 2017-09-28 ENCOUNTER — Other Ambulatory Visit: Payer: Self-pay | Admitting: Obstetrics and Gynecology

## 2017-10-09 ENCOUNTER — Other Ambulatory Visit (HOSPITAL_COMMUNITY)
Admission: RE | Admit: 2017-10-09 | Discharge: 2017-10-09 | Disposition: A | Discharge status: Home / Self Care | Payer: Medicaid Other | Source: Ambulatory Visit | Attending: Advanced Practice Midwife | Admitting: Advanced Practice Midwife

## 2017-10-09 ENCOUNTER — Ambulatory Visit (INDEPENDENT_AMBULATORY_CARE_PROVIDER_SITE_OTHER): Payer: Medicaid Other | Admitting: Advanced Practice Midwife

## 2017-10-09 VITALS — BP 99/72 | HR 90 | Wt 165.0 lb

## 2017-10-09 DIAGNOSIS — Z3A36 36 weeks gestation of pregnancy: Secondary | ICD-10-CM | POA: Insufficient documentation

## 2017-10-09 DIAGNOSIS — O09523 Supervision of elderly multigravida, third trimester: Secondary | ICD-10-CM | POA: Insufficient documentation

## 2017-10-09 DIAGNOSIS — O24415 Gestational diabetes mellitus in pregnancy, controlled by oral hypoglycemic drugs: Secondary | ICD-10-CM | POA: Diagnosis not present

## 2017-10-09 DIAGNOSIS — O43129 Velamentous insertion of umbilical cord, unspecified trimester: Secondary | ICD-10-CM | POA: Diagnosis not present

## 2017-10-09 DIAGNOSIS — O0990 Supervision of high risk pregnancy, unspecified, unspecified trimester: Secondary | ICD-10-CM | POA: Diagnosis not present

## 2017-10-09 DIAGNOSIS — O34219 Maternal care for unspecified type scar from previous cesarean delivery: Secondary | ICD-10-CM | POA: Diagnosis not present

## 2017-10-09 DIAGNOSIS — O283 Abnormal ultrasonic finding on antenatal screening of mother: Secondary | ICD-10-CM | POA: Diagnosis not present

## 2017-10-09 DIAGNOSIS — Z3A38 38 weeks gestation of pregnancy: Secondary | ICD-10-CM | POA: Diagnosis not present

## 2017-10-09 MED ORDER — GLYBURIDE 2.5 MG PO TABS: 2.5000 mg | ORAL_TABLET | Freq: Two times a day (BID) | ORAL | 1 refills | Status: DC

## 2017-10-09 NOTE — Patient Instructions (Signed)
Gestational Diabetes Mellitus, Self Care Caring for yourself after you have been diagnosed with gestational diabetes (gestational diabetes mellitus) means keeping your blood sugar (glucose) under control with a balance of:  Nutrition.  Exercise.  Lifestyle changes.  Medicines or insulin, if necessary.  Support from your team of health care providers and others.  The following information explains what you need to know to manage your gestational diabetes at home. What do I need to do to manage my blood glucose?  Check your blood glucose every day during your pregnancy. Do this as often as told by your health care provider.  Contact your health care provider if your blood glucose is above your target for 2 tests in a row. Your health care provider will set individualized treatment goals for you. Generally, the goal of treatment is to maintain the following blood glucose levels during pregnancy:  After not eating for 8 hours (after fasting): at or below 95 mg/dL (5.3 mmol/L).  After meals (postprandial): ? One hour after a meal: at or below 140 mg/dL (7.8 mmol/L). ? Two hours after a meal: at or below 120 mg/dL (6.7 mmol/L).  A1c (hemoglobin A1c) level: 6-6.5%.  What do I need to know about hyperglycemia and hypoglycemia? What is hyperglycemia? Hyperglycemia, also called high blood glucose, occurs when blood glucose is too high. Make sure you know the early signs of hyperglycemia, such as:  Increased thirst.  Hunger.  Feeling very tired.  Needing to urinate more often than usual.  Blurry vision.  What is hypoglycemia? Hypoglycemia, also called low blood glucose, occurswith a blood glucose level at or below 70 mg/dL (3.9 mmol/L). The risk for hypoglycemia increases during or after exercise, during sleep, during illness, and when skipping meals or not eating for a long time (fasting). It is important to know the symptoms of hypoglycemia and treat it right away. Always have a  15-gram rapid-acting carbohydrate snack with you to treat low blood glucose.Family members and close friends should also know the symptoms and should understand how to treat hypoglycemia, in case you are not able to treat yourself. What are the symptoms of hypoglycemia? Hypoglycemia symptoms can include:  Hunger.  Anxiety.  Sweating and feeling clammy.  Confusion.  Dizziness or feeling light-headed.  Sleepiness.  Nausea.  Increased heart rate.  Headache.  Blurry vision.  Seizure.  Nightmares.  Tingling or numbness around the mouth, lips, or tongue.  A change in speech.  Decreased ability to concentrate.  A change in coordination.  Restless sleep.  Tremors or shakes.  Fainting.  Irritability.  How do I treat hypoglycemia?  If you are alert and able to swallow safely, follow the 15:15 rule:  Take 15 grams of a rapid-acting carbohydrate. Rapid-acting options include: ? 1 tube of glucose gel. ? 3 glucose pills. ? 6-8 pieces of hard candy. ? 4 oz (120 mL) of fruit juice. ? 4 oz (120 mL) of regular (not diet) soda.  Check your blood glucose 15 minutes after you take the carbohydrate.  If the repeat blood glucose level is still at or below 70 mg/dL (3.9 mmol/L), take 15 grams of a carbohydrate again.  If your blood glucose level does not increase above 70 mg/dL (3.9 mmol/L) after 3 tries, seek emergency medical care.  After your blood glucose level returns to normal, eat a meal or a snack within 1 hour.  How do I treat severe hypoglycemia? Severe hypoglycemia is when your blood glucose level is at or below 54 mg/dL (  3 mmol/L). Severe hypoglycemia is an emergency. Do not wait to see if the symptoms will go away. Get medical help right away. Call your local emergency services (911 in the [place] Do not drive yourself to the hospital. If you have severe hypoglycemia and you cannot eat or drink, you may need an injection of glucagon. A family member or close  friend should learn how to check your blood glucose and how to give you a glucagon injection. Ask your health care provider if you need to have an emergency glucagon injection kit available. Severe hypoglycemia may need to be treated in a hospital. The treatment may include getting glucose through an IV tube. You may also need treatment for the cause of your hypoglycemia. What else can I do to manage my gestational diabetes? Take your diabetes medicines as told  If your health care provider prescribed insulin or diabetes medicines, take them every day.  Do not run out of insulin or other diabetes medicines that you take. Plan ahead so you always have these available.  If you use insulin, adjust your dosage based on how physically active you are and what foods you eat. Your health care provider will tell you how to adjust your dosage. Make healthy food choices  The things that you eat and drink affect your blood glucose. Making good choices helps to control your diabetes and prevent other health problems. A healthy meal plan includes eating lean proteins, complex carbohydrates, fresh fruits and vegetables, low-fat dairy products, and healthy fats. Make an appointment to see a diet and nutrition specialist (registered dietitian) to help you create an eating plan that is right for you. Make sure that you:  Follow instructions from your health care provider about eating or drinking restrictions.  Drink enough fluid to keep your urine clear or pale yellow.  Eat healthy snacks between nutritious meals.  Track the carbohydrates that you eat. Do this by reading food labels and learning the standard serving sizes of foods.  Follow your sick day plan whenever you cannot eat or drink as usual. Make this plan in advance with your health care provider.  Stay active   Do at least 30 minutes of physical activity a day, or as much physical activity as your health care provider recommends during your  pregnancy. ? Doing 10 minutes of exercise 30 minutes after each meal may help to control postprandial blood glucose levels.  If you start a new exercise or activity, work with your health care provider to adjust your insulin, medicines, or food intake as needed. Make healthy lifestyle choices  Do not drink alcohol.  Do not use any tobacco products, such as cigarettes, chewing tobacco, and e-cigarettes. If you need help quitting, ask your health care provider.  Learn to manage stress. If you need help with this, ask your health care provider. Care for your body  Keep your immunizations up to date.  Brush your teeth and gums two times a day, and floss at least one time a day.  Visit your dentist at least once every 6 months.  Maintain a healthy weight during your pregnancy. General instructions   Take over-the-counter and prescription medicines only as told by your health care provider.  Talk with your health care provider about your risk for high blood pressure during pregnancy (preeclampsia or eclampsia).  Share your diabetes management plan with people in your workplace, school, and household.  Check your urine for ketones during your pregnancy when you are ill and   as told by your health care provider.  Carry a medical alert card or wear medical alert jewelry.  Ask your health care provider: ? Do I need to meet with a diabetes educator? ? Where can I find a support group for people with diabetes?  Keep all follow-up visits during your pregnancy (prenatal) and after delivery (postnatal) as told by your health care provider. This is important. Get the care that you need after delivery  Have your blood glucose level checked 4-12 weeks after delivery. This is done with an oral glucose tolerance test (OGTT).  Get screened for diabetes at least every 3 years, or as often as told by your health care provider. Where to find more information: To learn more about gestational  diabetes, visit:  American Diabetes Association (ADA): www.diabetes.org/diabetes-basics/gestational  Centers for Disease Control and Prevention (CDC): www.cdc.gov/diabetes/pubs/pdf/gestationalDiabetes.pdf  This information is not intended to replace advice given to you by your health care provider. Make sure you discuss any questions you have with your health care provider. Document Released: 02/20/2016 Document Revised: 04/05/2016 Document Reviewed: 12/02/2015 Elsevier Interactive Patient Education  2017 Elsevier Inc.  

## 2017-10-09 NOTE — Progress Notes (Signed)
Pt brought the vitals record.

## 2017-10-09 NOTE — Progress Notes (Signed)
   PRENATAL VISIT NOTE  Subjective:  Theresa Avery is a 36 y.o. G3P2002 at 2442w5d being seen today for ongoing prenatal care.  She is currently monitored for the following issues for this high-risk pregnancy and has Advanced maternal age in multigravida; Supervision of high risk pregnancy, antepartum; Gestational diabetes mellitus (GDM) affecting pregnancy, antepartum; Velamentous insertion of umbilical cord, antepartum; and Previous cesarean section complicating pregnancy, antepartum condition or complication on their problem list.  Patient reports no complaints.  Contractions: Not present. Vag. Bleeding: None.  Movement: Present. Denies leaking of fluid.   The following portions of the patient's history were reviewed and updated as appropriate: allergies, current medications, past family history, past medical history, past social history, past surgical history and problem list. Problem list updated.  Objective:   Vitals:   10/09/17 1043  BP: 99/72  Pulse: 90  Weight: 165 lb (74.8 kg)    Fetal Status: Fetal Heart Rate (bpm): 148 Fundal Height: 37 cm Movement: Present     General:  Alert, oriented and cooperative. Patient is in no acute distress.  Skin: Skin is warm and dry. No rash noted.   Cardiovascular: Normal heart rate noted  Respiratory: Normal respiratory effort, no problems with respiration noted  Abdomen: Soft, gravid, appropriate for gestational age.  Pain/Pressure: Absent     Pelvic: Cervical exam performed Dilation: Closed Effacement (%): 0 Station: -3  Extremities: Normal range of motion.  Edema: Trace  Mental Status:  Normal mood and affect. Normal behavior. Normal judgment and thought content.   Fasting CBGs: 93-114 (90% out of range) PCB:  99-105 (10% out of range) PCL:  109-218 (90% out of range) PCD: 97-181 (90% out of range)  Assessment and Plan:  Pregnancy: G3P2002 at 7742w5d  1. Gestational diabetes mellitus (GDM) in third trimester controlled on oral  hypoglycemic drug  -Start antenatal testing - glyBURIDE (DIABETA) 2.5 MG tablet; Take 1 tablet (2.5 mg total) by mouth 2 (two) times daily with a meal.  Dispense: 60 tablet; Refill: 1 - US MFM OB FOLLOW UP; Future  2. Elderly multigravida in third trimester  - US MFM OB FOLLOW UP; Future  3. Previous cesarean section complicating pregnancy, antepartum condition or complication  - US MFM OB FOLLOW UP; Future  4. Supervision of high risk pregnancy, antepartum  - US MFM OB FOLLOW UP; Future  5. Velamentous insertion of umbilical cord, antepartum  - US MFM OB FOLLOW UP; Future  6. [redacted] weeks gestation of pregnancy  - US MFM OB FOLLOW UP; Future  7. Small head measurement - Growth US  Term labor symptoms and general obstetric precautions including but not limited to vaginal bleeding, contractions, leaking of fluid and fetal movement were reviewed in detail with the patient. Please refer to After Visit Summary for other counseling recommendations.  Return in about 1 week (around 10/16/2017) for BPP 10/11/17 at 0800, ROB.  Repeat US scheduled for 39 weeks.    Dorathy KinsmanVirginia Achillies Buehl, CNM

## 2017-10-10 LAB — CERVICOVAGINAL ANCILLARY ONLY
CHLAMYDIA, DNA PROBE: NEGATIVE
NEISSERIA GONORRHEA: NEGATIVE

## 2017-10-11 ENCOUNTER — Ambulatory Visit: Payer: Medicaid Other | Admitting: *Deleted

## 2017-10-11 ENCOUNTER — Ambulatory Visit: Payer: Self-pay

## 2017-10-11 VITALS — BP 114/81 | HR 79

## 2017-10-11 DIAGNOSIS — O24415 Gestational diabetes mellitus in pregnancy, controlled by oral hypoglycemic drugs: Secondary | ICD-10-CM

## 2017-10-11 NOTE — Progress Notes (Signed)
Pt informed that the ultrasound is considered a limited OB ultrasound and is not intended to be a complete ultrasound exam.  Patient also informed that the ultrasound is not being completed with the intent of assessing for fetal or placental anomalies or any pelvic abnormalities.  Explained that the purpose of today's ultrasound is to assess for presentation, BPP and amniotic fluid volume.  Patient acknowledges the purpose of the exam and the limitations of the study.    Video interpreter Darl PikesSusan [phone]#[id number] used for encounter

## 2017-10-11 NOTE — Progress Notes (Addendum)
BPP performed today was reviewed and was found to be 10/10 including a reactive NST.  AFI was also normal at 9.1 cm.  Continue recommended antenatal testing and prenatal care.

## 2017-10-13 LAB — CULTURE, BETA STREP (GROUP B ONLY): Strep Gp B Culture: NEGATIVE

## 2017-10-14 ENCOUNTER — Telehealth (HOSPITAL_COMMUNITY): Payer: Self-pay | Admitting: *Deleted

## 2017-10-14 NOTE — Pre-Procedure Instructions (Signed)
Interpreter number [phone]

## 2017-10-14 NOTE — Telephone Encounter (Signed)
Preadmission screen  

## 2017-10-16 ENCOUNTER — Other Ambulatory Visit: Payer: Self-pay | Admitting: Advanced Practice Midwife

## 2017-10-16 ENCOUNTER — Telehealth (HOSPITAL_COMMUNITY): Payer: Self-pay | Admitting: *Deleted

## 2017-10-16 ENCOUNTER — Encounter (HOSPITAL_COMMUNITY): Payer: Self-pay

## 2017-10-16 ENCOUNTER — Ambulatory Visit (HOSPITAL_COMMUNITY)
Admission: RE | Admit: 2017-10-16 | Discharge: 2017-10-16 | Disposition: A | Discharge status: Home / Self Care | Payer: Medicaid Other | Source: Ambulatory Visit | Attending: Advanced Practice Midwife | Admitting: Advanced Practice Midwife

## 2017-10-16 ENCOUNTER — Ambulatory Visit (INDEPENDENT_AMBULATORY_CARE_PROVIDER_SITE_OTHER): Payer: Medicaid Other | Admitting: Family Medicine

## 2017-10-16 ENCOUNTER — Ambulatory Visit (INDEPENDENT_AMBULATORY_CARE_PROVIDER_SITE_OTHER): Payer: Medicaid Other | Admitting: *Deleted

## 2017-10-16 ENCOUNTER — Other Ambulatory Visit: Payer: Self-pay | Admitting: Family Medicine

## 2017-10-16 VITALS — BP 106/69 | HR 80

## 2017-10-16 DIAGNOSIS — O0993 Supervision of high risk pregnancy, unspecified, third trimester: Secondary | ICD-10-CM

## 2017-10-16 DIAGNOSIS — O24415 Gestational diabetes mellitus in pregnancy, controlled by oral hypoglycemic drugs: Secondary | ICD-10-CM | POA: Insufficient documentation

## 2017-10-16 DIAGNOSIS — O43123 Velamentous insertion of umbilical cord, third trimester: Secondary | ICD-10-CM | POA: Insufficient documentation

## 2017-10-16 DIAGNOSIS — Z3A37 37 weeks gestation of pregnancy: Secondary | ICD-10-CM

## 2017-10-16 DIAGNOSIS — O34219 Maternal care for unspecified type scar from previous cesarean delivery: Secondary | ICD-10-CM

## 2017-10-16 DIAGNOSIS — O0990 Supervision of high risk pregnancy, unspecified, unspecified trimester: Secondary | ICD-10-CM

## 2017-10-16 DIAGNOSIS — O09523 Supervision of elderly multigravida, third trimester: Secondary | ICD-10-CM | POA: Insufficient documentation

## 2017-10-16 DIAGNOSIS — O358XX0 Maternal care for other (suspected) fetal abnormality and damage, not applicable or unspecified: Secondary | ICD-10-CM | POA: Insufficient documentation

## 2017-10-16 DIAGNOSIS — O43129 Velamentous insertion of umbilical cord, unspecified trimester: Secondary | ICD-10-CM

## 2017-10-16 DIAGNOSIS — Z3A38 38 weeks gestation of pregnancy: Secondary | ICD-10-CM

## 2017-10-16 NOTE — Progress Notes (Signed)
Subjective:  Theresa Avery is a 36 y.o. G3P2002 at 8580w5d being seen today for ongoing prenatal care.  She is currently monitored for the following issues for this high-risk pregnancy and has Advanced maternal age in multigravida; Supervision of high risk pregnancy, antepartum; Gestational diabetes mellitus (GDM) affecting pregnancy, antepartum; Velamentous insertion of umbilical cord, antepartum; Previous cesarean section complicating pregnancy, antepartum condition or complication; and Abnormal ultrasonic finding on antenatal screening of mother, antepartum on their problem list.  GDM: Patient taking glyburide 2.5mg  BID.  Reports no hypoglycemic episodes.  Tolerating medication well Fasting: 75-109 (4 elevated) 2hr PP: 95-135 (1 elevated)  Patient reports occasional SOB and palpitation when laying flat - improved with propping herself up with pillows.  Contractions: Not present. Vag. Bleeding: None.  Movement: Present. Denies leaking of fluid.   The following portions of the patient's history were reviewed and updated as appropriate: allergies, current medications, past family history, past medical history, past social history, past surgical history and problem list. Problem list updated.  Objective:   Vitals:   10/16/17 1330  BP: 106/69  Pulse: 80    Fetal Status: Fetal Heart Rate (bpm): RNST   Movement: Present     General:  Alert, oriented and cooperative. Patient is in no acute distress.  Skin: Skin is warm and dry. No rash noted.   Cardiovascular: Normal heart rate noted  Respiratory: Normal respiratory effort, no problems with respiration noted  Abdomen: Soft, gravid, appropriate for gestational age. Pain/Pressure: Absent     Pelvic: Vag. Bleeding: None     Cervical exam performed        Extremities: Normal range of motion.     Mental Status: Normal mood and affect. Normal behavior. Normal judgment and thought content.   Urinalysis:      Assessment and Plan:  Pregnancy:  G3P2002 at 3480w5d  1. Supervision of high risk pregnancy, antepartum FHT and FH normal  2. Elderly multigravida in third trimester  3. Gestational diabetes mellitus (GDM) in third trimester controlled on oral hypoglycemic drug Continue medication and antenatal testing. NST reactive  4. Previous cesarean section complicating pregnancy, antepartum condition or complication Scheduled for repeat on 12/14  5. Velamentous insertion of umbilical cord, antepartum  Term labor symptoms and general obstetric precautions including but not limited to vaginal bleeding, contractions, leaking of fluid and fetal movement were reviewed in detail with the patient. Please refer to After Visit Summary for other counseling recommendations.  Return in about 3 weeks (around 11/06/2017) for 2 wk post op - has C/S on 12/14; also needs PP appt  in 5-6 weeks.   Levie HeritageStinson, Jacob J, DO

## 2017-10-16 NOTE — Progress Notes (Signed)
US for growth and BPP (8/8) done today

## 2017-10-16 NOTE — Progress Notes (Signed)
Video interpreter May #[id number] used for encounter. Repeat C/S scheduled on 12/14.

## 2017-10-16 NOTE — Telephone Encounter (Signed)
Preadmission screen  

## 2017-10-16 NOTE — Pre-Procedure Instructions (Signed)
Interpreter number [phone]

## 2017-10-24 ENCOUNTER — Encounter (HOSPITAL_COMMUNITY)
Admission: RE | Admit: 2017-10-24 | Discharge: 2017-10-24 | Disposition: A | Discharge status: Home / Self Care | Payer: Medicaid Other | Source: Ambulatory Visit | Attending: Obstetrics and Gynecology | Admitting: Obstetrics and Gynecology

## 2017-10-24 LAB — CBC
HEMATOCRIT: 39.4 % (ref 36.0–46.0)
HEMOGLOBIN: 13.1 g/dL (ref 12.0–15.0)
MCH: 29.9 pg (ref 26.0–34.0)
MCHC: 33.2 g/dL (ref 30.0–36.0)
MCV: 90 fL (ref 78.0–100.0)
Platelets: 185 10*3/uL (ref 150–400)
RBC: 4.38 MIL/uL (ref 3.87–5.11)
RDW: 14.9 % (ref 11.5–15.5)
WBC: 6.3 10*3/uL (ref 4.0–10.5)

## 2017-10-24 LAB — TYPE AND SCREEN
ABO/RH(D): B POS
Antibody Screen: NEGATIVE

## 2017-10-24 LAB — ABO/RH: ABO/RH(D): B POS

## 2017-10-24 NOTE — Patient Instructions (Signed)
Cain SieveSeng Bu Alvis  10/24/2017   Your procedure is scheduled on:  10/25/2017  [facility] 0730 AM.  Pick up the phone at the desk and dial [phone]  Call this number if you have problems the morning of surgery:[phone]  Remember:   Do not eat food:After Midnight.  Do not drink clear liquids: After Midnight.  Take these medicines the morning of surgery with A SIP OF WATER: none   Do not wear jewelry, make-up or nail polish.  Do not wear lotions, powders, or perfumes. Do not wear deodorant.  Do not shave 48 hours prior to surgery.  Do not bring valuables to the hospital.  [facility] not   responsible for any belongings or valuables brought to the hospital.  Contacts, dentures or bridgework may not be worn into surgery.  Leave suitcase in the car. After surgery it may be brought to your room.  For patients admitted to the hospital, checkout time is 11:00 AM the day of              discharge.    N/A   Please read over the following fact sheets that you were given:   Surgical Site Infection Prevention

## 2017-10-25 ENCOUNTER — Inpatient Hospital Stay (HOSPITAL_COMMUNITY): Payer: Medicaid Other | Admitting: Certified Registered Nurse Anesthetist

## 2017-10-25 ENCOUNTER — Encounter (HOSPITAL_COMMUNITY): Payer: Self-pay | Admitting: *Deleted

## 2017-10-25 ENCOUNTER — Encounter (HOSPITAL_COMMUNITY)
Admission: RE | Disposition: A | Discharge status: Home / Self Care | Payer: Self-pay | Source: Ambulatory Visit | Attending: Obstetrics and Gynecology

## 2017-10-25 ENCOUNTER — Inpatient Hospital Stay (HOSPITAL_COMMUNITY)
Admission: RE | Admit: 2017-10-25 | Discharge: 2017-10-27 | DRG: 788 | Disposition: A | Discharge status: Home / Self Care | Payer: Medicaid Other | Source: Ambulatory Visit | Attending: Obstetrics and Gynecology | Admitting: Obstetrics and Gynecology

## 2017-10-25 DIAGNOSIS — R339 Retention of urine, unspecified: Secondary | ICD-10-CM | POA: Diagnosis not present

## 2017-10-25 DIAGNOSIS — Z3A39 39 weeks gestation of pregnancy: Secondary | ICD-10-CM | POA: Diagnosis not present

## 2017-10-25 DIAGNOSIS — O34211 Maternal care for low transverse scar from previous cesarean delivery: Secondary | ICD-10-CM | POA: Diagnosis present

## 2017-10-25 DIAGNOSIS — O43123 Velamentous insertion of umbilical cord, third trimester: Secondary | ICD-10-CM | POA: Diagnosis present

## 2017-10-25 DIAGNOSIS — O24429 Gestational diabetes mellitus in childbirth, unspecified control: Secondary | ICD-10-CM | POA: Diagnosis not present

## 2017-10-25 DIAGNOSIS — O24425 Gestational diabetes mellitus in childbirth, controlled by oral hypoglycemic drugs: Secondary | ICD-10-CM | POA: Diagnosis present

## 2017-10-25 DIAGNOSIS — Z98891 History of uterine scar from previous surgery: Secondary | ICD-10-CM

## 2017-10-25 LAB — GLUCOSE, CAPILLARY
GLUCOSE-CAPILLARY: 84 mg/dL (ref 65–99)
GLUCOSE-CAPILLARY: 93 mg/dL (ref 65–99)

## 2017-10-25 LAB — RPR: RPR Ser Ql: NONREACTIVE

## 2017-10-25 SURGERY — Surgical Case
Anesthesia: Spinal | Site: Abdomen | Wound class: Clean Contaminated

## 2017-10-25 MED ORDER — SODIUM CHLORIDE 0.9% FLUSH: 3.0000 mL | INTRAVENOUS | Status: DC | PRN

## 2017-10-25 MED ORDER — PROMETHAZINE HCL 25 MG/ML IJ SOLN: 6.2500 mg | INTRAMUSCULAR | Status: DC | PRN

## 2017-10-25 MED ORDER — BUPIVACAINE HCL (PF) 0.75 % IJ SOLN: INTRAMUSCULAR | Status: DC | PRN

## 2017-10-25 MED ORDER — SOD CITRATE-CITRIC ACID 500-334 MG/5ML PO SOLN
30.0000 mL | ORAL | Status: AC
Start: 2017-10-25 — End: 2017-10-25
  Administered 2017-10-25: 30 mL via ORAL
  Filled 2017-10-25: qty 15

## 2017-10-25 MED ORDER — NALOXONE HCL 0.4 MG/ML IJ SOLN: 0.4000 mg | INTRAMUSCULAR | Status: DC | PRN

## 2017-10-25 MED ORDER — TETANUS-DIPHTH-ACELL PERTUSSIS 5-2.5-18.5 LF-MCG/0.5 IM SUSP: 0.5000 mL | Freq: Once | INTRAMUSCULAR | Status: DC

## 2017-10-25 MED ORDER — DIPHENHYDRAMINE HCL 25 MG PO CAPS
25.0000 mg | ORAL_CAPSULE | ORAL | Status: DC | PRN
  Filled 2017-10-25: qty 1

## 2017-10-25 MED ORDER — OXYCODONE HCL 5 MG PO TABS
5.0000 mg | ORAL_TABLET | ORAL | Status: DC | PRN
  Administered 2017-10-27: 5 mg via ORAL
  Filled 2017-10-25: qty 1

## 2017-10-25 MED ORDER — MENTHOL 3 MG MT LOZG: 1.0000 | LOZENGE | OROMUCOSAL | Status: DC | PRN

## 2017-10-25 MED ORDER — ONDANSETRON HCL 4 MG/2ML IJ SOLN
INTRAMUSCULAR | Status: AC
  Filled 2017-10-25: qty 2

## 2017-10-25 MED ORDER — WITCH HAZEL-GLYCERIN EX PADS: 1.0000 "application " | MEDICATED_PAD | CUTANEOUS | Status: DC | PRN

## 2017-10-25 MED ORDER — LACTATED RINGERS IV SOLN
INTRAVENOUS | Status: DC
  Administered 2017-10-25 (×2): via INTRAVENOUS

## 2017-10-25 MED ORDER — FENTANYL CITRATE (PF) 100 MCG/2ML IJ SOLN
INTRAMUSCULAR | Status: AC
  Filled 2017-10-25: qty 2

## 2017-10-25 MED ORDER — PHENYLEPHRINE HCL 10 MG/ML IJ SOLN
INTRAMUSCULAR | Status: DC | PRN
  Administered 2017-10-25: 60 ug via INTRAVENOUS
  Administered 2017-10-25: 80 ug via INTRAVENOUS
  Administered 2017-10-25: 60 ug via INTRAVENOUS

## 2017-10-25 MED ORDER — DIPHENHYDRAMINE HCL 50 MG/ML IJ SOLN: 12.5000 mg | INTRAMUSCULAR | Status: DC | PRN

## 2017-10-25 MED ORDER — MORPHINE SULFATE (PF) 0.5 MG/ML IJ SOLN
INTRAMUSCULAR | Status: AC
  Filled 2017-10-25: qty 10

## 2017-10-25 MED ORDER — SODIUM CHLORIDE 0.9 % IR SOLN
Status: DC | PRN
Start: 2017-10-25 — End: 2017-10-25
  Administered 2017-10-25: 1000 mL

## 2017-10-25 MED ORDER — OXYCODONE HCL 5 MG/5ML PO SOLN: 5.0000 mg | Freq: Once | ORAL | Status: DC | PRN

## 2017-10-25 MED ORDER — MORPHINE SULFATE (PF) 0.5 MG/ML IJ SOLN
INTRAMUSCULAR | Status: DC | PRN
  Administered 2017-10-25: .2 mg via INTRATHECAL

## 2017-10-25 MED ORDER — KETOROLAC TROMETHAMINE 30 MG/ML IJ SOLN
INTRAMUSCULAR | Status: AC
  Administered 2017-10-25: 30 mg via INTRAMUSCULAR
  Filled 2017-10-25: qty 1

## 2017-10-25 MED ORDER — CEFAZOLIN SODIUM-DEXTROSE 2-4 GM/100ML-% IV SOLN
2.0000 g | INTRAVENOUS | Status: AC
  Administered 2017-10-25: 2 g via INTRAVENOUS
  Filled 2017-10-25 (×2): qty 100

## 2017-10-25 MED ORDER — OXYCODONE HCL 5 MG PO TABS: 5.0000 mg | ORAL_TABLET | Freq: Once | ORAL | Status: DC | PRN

## 2017-10-25 MED ORDER — OXYTOCIN 40 UNITS IN LACTATED RINGERS INFUSION - SIMPLE MED: 2.5000 [IU]/h | INTRAVENOUS | Status: AC

## 2017-10-25 MED ORDER — OXYTOCIN 10 UNIT/ML IJ SOLN
INTRAMUSCULAR | Status: AC
  Filled 2017-10-25: qty 4

## 2017-10-25 MED ORDER — OXYCODONE HCL 5 MG PO TABS: 10.0000 mg | ORAL_TABLET | ORAL | Status: DC | PRN

## 2017-10-25 MED ORDER — COCONUT OIL OIL: 1.0000 "application " | TOPICAL_OIL | Status: DC | PRN

## 2017-10-25 MED ORDER — LACTATED RINGERS IV SOLN
INTRAVENOUS | Status: DC | PRN
  Administered 2017-10-25: 12:00:00 via INTRAVENOUS

## 2017-10-25 MED ORDER — LACTATED RINGERS IV SOLN
INTRAVENOUS | Status: DC
  Administered 2017-10-25 – 2017-10-26 (×2): via INTRAVENOUS

## 2017-10-25 MED ORDER — PRENATAL MULTIVITAMIN CH
1.0000 | ORAL_TABLET | Freq: Every day | ORAL | Status: DC
  Administered 2017-10-26 – 2017-10-27 (×2): 1 via ORAL
  Filled 2017-10-25 (×2): qty 1

## 2017-10-25 MED ORDER — FENTANYL CITRATE (PF) 100 MCG/2ML IJ SOLN
INTRAMUSCULAR | Status: DC | PRN
  Administered 2017-10-25: 10 ug via INTRATHECAL

## 2017-10-25 MED ORDER — HYDROMORPHONE HCL 1 MG/ML IJ SOLN: 0.2500 mg | INTRAMUSCULAR | Status: DC | PRN

## 2017-10-25 MED ORDER — ONDANSETRON HCL 4 MG/2ML IJ SOLN: 4.0000 mg | Freq: Three times a day (TID) | INTRAMUSCULAR | Status: DC | PRN

## 2017-10-25 MED ORDER — MEPERIDINE HCL 25 MG/ML IJ SOLN: 6.2500 mg | INTRAMUSCULAR | Status: DC | PRN

## 2017-10-25 MED ORDER — NALOXONE HCL 0.4 MG/ML IJ SOLN: 1.0000 ug/kg/h | INTRAMUSCULAR | Status: DC | PRN

## 2017-10-25 MED ORDER — IBUPROFEN 600 MG PO TABS
600.0000 mg | ORAL_TABLET | Freq: Four times a day (QID) | ORAL | Status: DC
  Administered 2017-10-25 – 2017-10-27 (×7): 600 mg via ORAL
  Filled 2017-10-25 (×7): qty 1

## 2017-10-25 MED ORDER — SCOPOLAMINE 1 MG/3DAYS TD PT72: 1.0000 | MEDICATED_PATCH | Freq: Once | TRANSDERMAL | Status: DC

## 2017-10-25 MED ORDER — PHENYLEPHRINE 40 MCG/ML (10ML) SYRINGE FOR IV PUSH (FOR BLOOD PRESSURE SUPPORT)
PREFILLED_SYRINGE | INTRAVENOUS | Status: AC
  Filled 2017-10-25: qty 10

## 2017-10-25 MED ORDER — DIBUCAINE 1 % RE OINT: 1.0000 "application " | TOPICAL_OINTMENT | RECTAL | Status: DC | PRN

## 2017-10-25 MED ORDER — NALBUPHINE HCL 10 MG/ML IJ SOLN: 5.0000 mg | INTRAMUSCULAR | Status: DC | PRN

## 2017-10-25 MED ORDER — NALBUPHINE HCL 10 MG/ML IJ SOLN: 5.0000 mg | Freq: Once | INTRAMUSCULAR | Status: DC | PRN

## 2017-10-25 MED ORDER — OXYTOCIN 10 UNIT/ML IJ SOLN
INTRAVENOUS | Status: DC | PRN
  Administered 2017-10-25: 40 [IU] via INTRAVENOUS

## 2017-10-25 MED ORDER — BUPIVACAINE IN DEXTROSE 0.75-8.25 % IT SOLN
INTRATHECAL | Status: DC | PRN
  Administered 2017-10-25: 1.4 mL via INTRATHECAL

## 2017-10-25 MED ORDER — PHENYLEPHRINE 8 MG IN D5W 100 ML (0.08MG/ML) PREMIX OPTIME
INJECTION | INTRAVENOUS | Status: DC | PRN
  Administered 2017-10-25: 60 ug/min via INTRAVENOUS

## 2017-10-25 MED ORDER — SENNOSIDES-DOCUSATE SODIUM 8.6-50 MG PO TABS: 2.0000 | ORAL_TABLET | Freq: Every evening | ORAL | Status: DC | PRN

## 2017-10-25 MED ORDER — ZOLPIDEM TARTRATE 5 MG PO TABS: 5.0000 mg | ORAL_TABLET | Freq: Every evening | ORAL | Status: DC | PRN

## 2017-10-25 MED ORDER — SIMETHICONE 80 MG PO CHEW
80.0000 mg | CHEWABLE_TABLET | Freq: Three times a day (TID) | ORAL | Status: DC
  Administered 2017-10-26 – 2017-10-27 (×4): 80 mg via ORAL
  Filled 2017-10-25 (×5): qty 1

## 2017-10-25 MED ORDER — ONDANSETRON HCL 4 MG/2ML IJ SOLN
INTRAMUSCULAR | Status: DC | PRN
  Administered 2017-10-25: 4 mg via INTRAVENOUS

## 2017-10-25 MED ORDER — ACETAMINOPHEN 325 MG PO TABS: 650.0000 mg | ORAL_TABLET | ORAL | Status: DC | PRN

## 2017-10-25 MED ORDER — POLYETHYLENE GLYCOL 3350 17 G PO PACK
17.0000 g | PACK | Freq: Every day | ORAL | Status: DC
  Administered 2017-10-26 – 2017-10-27 (×2): 17 g via ORAL
  Filled 2017-10-25 (×3): qty 1

## 2017-10-25 SURGICAL SUPPLY — 37 items
BENZOIN TINCTURE PRP APPL 2/3 (GAUZE/BANDAGES/DRESSINGS) ×3 IMPLANT
CANISTER SUCT 3000ML PPV (MISCELLANEOUS) ×3 IMPLANT
CHLORAPREP W/TINT 26ML (MISCELLANEOUS) ×3 IMPLANT
CLOSURE STERI STRIP 1/2 X4 (GAUZE/BANDAGES/DRESSINGS) ×2 IMPLANT
CLOSURE WOUND 1/2 X4 (GAUZE/BANDAGES/DRESSINGS) ×1
DERMABOND ADVANCED (GAUZE/BANDAGES/DRESSINGS) ×2
DERMABOND ADVANCED .7 DNX12 (GAUZE/BANDAGES/DRESSINGS) ×1 IMPLANT
DRSG OPSITE POSTOP 4X10 (GAUZE/BANDAGES/DRESSINGS) ×3 IMPLANT
ELECT REM PT RETURN 9FT ADLT (ELECTROSURGICAL) ×3
ELECTRODE REM PT RTRN 9FT ADLT (ELECTROSURGICAL) ×1 IMPLANT
EXTRACTOR VACUUM KIWI (MISCELLANEOUS) ×3 IMPLANT
GLOVE BIOGEL PI IND STRL 7.0 (GLOVE) ×2 IMPLANT
GLOVE BIOGEL PI IND STRL 7.5 (GLOVE) ×1 IMPLANT
GLOVE BIOGEL PI INDICATOR 7.0 (GLOVE) ×4
GLOVE BIOGEL PI INDICATOR 7.5 (GLOVE) ×2
GLOVE SKINSENSE NS SZ7.0 (GLOVE) ×2
GLOVE SKINSENSE STRL SZ7.0 (GLOVE) ×1 IMPLANT
GOWN STRL REUS W/ TWL LRG LVL3 (GOWN DISPOSABLE) ×2 IMPLANT
GOWN STRL REUS W/ TWL XL LVL3 (GOWN DISPOSABLE) ×1 IMPLANT
GOWN STRL REUS W/TWL LRG LVL3 (GOWN DISPOSABLE) ×4
GOWN STRL REUS W/TWL XL LVL3 (GOWN DISPOSABLE) ×2
NS IRRIG 1000ML POUR BTL (IV SOLUTION) ×3 IMPLANT
PACK C SECTION WH (CUSTOM PROCEDURE TRAY) ×3 IMPLANT
PAD ABD 7.5X8 STRL (GAUZE/BANDAGES/DRESSINGS) ×3 IMPLANT
PAD OB MATERNITY 4.3X12.25 (PERSONAL CARE ITEMS) ×3 IMPLANT
PAD PREP 24X48 CUFFED NSTRL (MISCELLANEOUS) ×3 IMPLANT
PENCIL SMOKE EVAC W/HOLSTER (ELECTROSURGICAL) ×3 IMPLANT
RTRCTR C-SECT PINK 25CM LRG (MISCELLANEOUS) ×3 IMPLANT
STRIP CLOSURE SKIN 1/2X4 (GAUZE/BANDAGES/DRESSINGS) ×2 IMPLANT
SUT MNCRL 0 VIOLET CTX 36 (SUTURE) ×3 IMPLANT
SUT MON AB 4-0 PS1 27 (SUTURE) ×3 IMPLANT
SUT MONOCRYL 0 CTX 36 (SUTURE) ×6
SUT PLAIN 2 0 XLH (SUTURE) ×3 IMPLANT
SUT VIC AB 0 CT1 36 (SUTURE) ×6 IMPLANT
SUT VIC AB 3-0 CT1 27 (SUTURE) ×2
SUT VIC AB 3-0 CT1 TAPERPNT 27 (SUTURE) ×1 IMPLANT
TOWEL OR 17X24 6PK STRL BLUE (TOWEL DISPOSABLE) ×6 IMPLANT

## 2017-10-25 NOTE — Addendum Note (Signed)
Addendum  created 10/25/17 1744 by Angela AdamWrinkle, Miklos Bidinger G, CRNA   Sign clinical note

## 2017-10-25 NOTE — Anesthesia Procedure Notes (Deleted)
Spinal  Patient location during procedure: OR Start time: 10/25/2017 11:55 AM End time: 10/25/2017 12:00 PM Staffing Performed: other anesthesia staff  Preanesthetic Checklist Completed: patient identified, surgical consent, pre-op evaluation, IV checked, risks and benefits discussed and monitors and equipment checked Spinal Block Patient position: sitting Patient monitoring: heart rate, cardiac monitor and blood pressure Approach: midline Location: L3-4 Injection technique: single-shot Needle Needle type: Pencan  Assessment Sensory level: T4

## 2017-10-25 NOTE — Addendum Note (Signed)
Addendum  created 10/25/17 1746 by Angela AdamWrinkle, Chai Verdejo G, CRNA   Delete clinical note, Sign clinical note

## 2017-10-25 NOTE — Anesthesia Postprocedure Evaluation (Deleted)
Anesthesia Post Note  Patient: Theresa Avery  Procedure(s) Performed: CESAREAN SECTION (N/A Abdomen)     Patient location during evaluation: Mother Baby Anesthesia Type: Spinal Level of consciousness: awake and alert, oriented and patient cooperative Pain management: pain level controlled Vital Signs Assessment: post-procedure vital signs reviewed and stable Respiratory status: spontaneous breathing Cardiovascular status: stable Postop Assessment: no headache, epidural receding, patient able to bend at knees and no signs of nausea or vomiting Anesthetic complications: no Comments: Interpreter used for interview.  Pt demonstrates able to bend both knees almost 90 degrees.  Reports pain score 1.    Last Vitals:  Vitals:   10/25/17 1530 10/25/17 1631  BP: (!) 94/52 (!) 93/56  Pulse: 61 64  Resp: 16 16  Temp: 36.4 C (!) 36.4 C  SpO2:  100%    Last Pain:  Vitals:   10/25/17 1631  TempSrc: Oral   Pain Goal: Patients Stated Pain Goal: 4 (10/25/17 [id number])               Merrilyn PumaWRINKLE,Maressa Apollo

## 2017-10-25 NOTE — Transfer of Care (Signed)
Immediate Anesthesia Transfer of Care Note  Patient: Theresa Avery  Procedure(s) Performed: CESAREAN SECTION (N/A Abdomen)  Patient Location: PACU  Anesthesia Type:Spinal  Level of Consciousness: awake, alert  and oriented  Airway & Oxygen Therapy: Patient Spontanous Breathing  Post-op Assessment: Report given to RN and Post -op Vital signs reviewed and stable  Post vital signs: stable  Last Vitals:  Vitals:   10/25/17 0822  BP: 108/69  Pulse: 80  Resp: 18  Temp: (!) 36.4 C    Last Pain:  Vitals:   10/25/17 0822  TempSrc: Oral      Patients Stated Pain Goal: 4 (10/25/17 [id number])  Complications: No apparent anesthesia complications

## 2017-10-25 NOTE — Anesthesia Procedure Notes (Signed)
Spinal  Patient location during procedure: OB Start time: 10/25/2017 11:55 AM End time: 10/25/2017 12:00 PM Staffing Anesthesiologist: Lowella CurbMiller, Bennett Ram Ray, MD Performed: anesthesiologist  Preanesthetic Checklist Completed: patient identified, surgical consent, pre-op evaluation, timeout performed, IV checked, risks and benefits discussed and monitors and equipment checked Spinal Block Patient position: sitting Prep: site prepped and draped and DuraPrep Patient monitoring: heart rate, cardiac monitor, continuous pulse ox and blood pressure Approach: midline Location: L3-4 Injection technique: single-shot Needle Needle type: Pencan  Needle gauge: 24 G Needle length: 10 cm Assessment Sensory level: T4 Additional Notes Spinal anesthetic placed by Nurse anesthesia student under direct supervision of anesthesilogist

## 2017-10-25 NOTE — H&P (Signed)
Obstetrics Admission History & Physical  10/25/2017 - 9:11 AM Primary O[facility]: [facility]  Chief Complaint: scheduled rpt c-section  History of Present Illness  36 y.o. G3P2002 @ [redacted]w[redacted]d, with the above CC. Pregnancy complicated by: GDMa2 (bid glyburide), h/o c-section x 2, AMA  Ms. Gladis Duch states that she has no s/s of decreased FM or labor. She held her glyburide for today  Review of Systems: as noted in the History of Present Illness.  PMHx:  Past Medical History:  Diagnosis Date  . Anemia    Hx with 1st pregnancy  . Appendicitis    Did not have surgery  . Depression   . GERD (gastroesophageal reflux disease)   . Malaria    PSHx:  Past Surgical History:  Procedure Laterality Date  . APPENDECTOMY    . CESAREAN SECTION    . CESAREAN SECTION  07/12/2011   Procedure: CESAREAN SECTION;  Surgeon: Luther H Eure, MD;  Location: [facility];  Service: Gynecology;  Laterality: N/A;   Medications:  Medications Prior to Admission  Medication Sig Dispense Refill Last Dose  . glyBURIDE (DIABETA) 2.5 MG tablet Take 1 tablet (2.5 mg total) by mouth 2 (two) times daily with a meal. 60 tablet 1 10/24/2017 at Unknown time  . prenatal vitamin w/FE, FA (PRENATAL 1 + 1) 27-1 MG TABS Take 1 tablet by mouth daily.     10/24/2017 at Unknown time  . ACCU-CHEK FASTCLIX LANCETS MISC 1 each 4 (four) times daily by Percutaneous route. 100 each 12 Taking  . Blood Glucose Monitoring Suppl (ACCU-CHEK GUIDE) w/Device KIT 1 Units 4 (four) times daily by Does not apply route. 1 kit 0 Taking  . glucose blood (ACCU-CHEK GUIDE) test strip Use as instructed 50 each 12 Taking     Allergies: has No Known Allergies. OBHx:  OB History  Gravida Para Term Preterm AB Living  3 2 2 0 0 2  SAB TAB Ectopic Multiple Live Births  0 0 0 0 2    # Outcome Date GA Lbr Len/2nd Weight Sex Delivery Anes PTL Lv  3 Current           2 Term 07/12/11 [redacted]w[redacted]d  8 lb 0.6 oz (3.645 kg) M CS-Vac EPI  LIV  1 Term  01/22/09    M CS-LTranv EPI N LIV          FHx: No family history on file. Soc Hx:  Social History   Socioeconomic History  . Marital status: Married    Spouse name: Not on file  . Number of children: Not on file  . Years of education: Not on file  . Highest education level: Not on file  Social Needs  . Financial resource strain: Not on file  . Food insecurity - worry: Not on file  . Food insecurity - inability: Not on file  . Transportation needs - medical: Not on file  . Transportation needs - non-medical: Not on file  Occupational History  . Not on file  Tobacco Use  . Smoking status: Never Smoker  . Smokeless tobacco: Never Used  Substance and Sexual Activity  . Alcohol use: No  . Drug use: No  . Sexual activity: Yes  Other Topics Concern  . Not on file  Social History Narrative  . Not on file    Objective    Current Vital Signs 24h Vital Sign Ranges  T (!) 97.5 F (36.4 C) Temp  Avg: 97.5 F (36.4 C)    Min: 97.5 F (36.4 C)  Max: 97.5 F (36.4 C)  BP 108/69 BP  Min: 108/69  Max: 108/69  HR 80 Pulse  Avg: 80  Min: 80  Max: 80  RR 18 Resp  Avg: 18  Min: 18  Max: 18  SaO2   Not Delivered No Data Recorded       24 Hour I/O Current Shift I/O  Time Ins Outs No intake/output data recorded. No intake/output data recorded.    General: Well nourished, well developed female in no acute distress.  Skin:  Warm and dry.  Cardiovascular: S1, S2 normal, no murmur, rub or gallop, regular rate and rhythm Respiratory:  Clear to auscultation bilateral. Normal respiratory effort Abdomen: gravid, well healed low transverse skin incision Neuro/Psych:  Normal mood and affect.    Labs  B POS  Recent Labs  Lab 10/24/17 0950  WBC 6.3  HGB 13.1  HCT 39.4  PLT 185    Radiology 12/5: 68%, anterior placenta  Assessment & Plan   36 y.o. [id number] @ 45w0ddoing well *Pregnancy: no issues currently. For rpt c-section this morning. Pt does not want a BTL. D/w mom and dad  that will have to move theirs to later in the morning due to another case. They are fine with this *GDMa2: f/u post delivery CBGs and will check one for this morning *GBS: neg  Interpreter used.   CDurene RomansMD Attending Center for [facility]

## 2017-10-25 NOTE — Anesthesia Postprocedure Evaluation (Signed)
Anesthesia Post Note  Patient: Seng Bu Littlefield  Procedure(s) Performed: CESAREAN SECTION (N/A Abdomen)     Patient location during evaluation: PACU Anesthesia Type: Spinal Level of consciousness: oriented and awake and alert Pain management: pain level controlled Vital Signs Assessment: post-procedure vital signs reviewed and stable Respiratory status: spontaneous breathing and respiratory function stable Cardiovascular status: blood pressure returned to baseline and stable Postop Assessment: no headache, no backache and no apparent nausea or vomiting Anesthetic complications: no    Last Vitals:  Vitals:   10/25/17 1319 10/25/17 1320  BP:    Pulse: 72 75  Resp: 16 15  Temp:    SpO2: 98% 98%    Last Pain:  Vitals:   10/25/17 0822  TempSrc: Oral   Pain Goal: Patients Stated Pain Goal: 4 (10/25/17 0822)               Lowella CurbWarren Ray Iris Hairston

## 2017-10-25 NOTE — Anesthesia Preprocedure Evaluation (Signed)
Anesthesia Evaluation  Patient identified by MRN, date of birth, ID band Patient awake    Reviewed: Allergy & Precautions, H&P , NPO status , Patient's Chart, lab work & pertinent test results  Airway Mallampati: I  TM Distance: >3 FB Neck ROM: full    Dental no notable dental hx.    Pulmonary neg pulmonary ROS,    Pulmonary exam normal breath sounds clear to auscultation       Cardiovascular negative cardio ROS Normal cardiovascular exam Rhythm:Regular Rate:Normal     Neuro/Psych Depression negative neurological ROS  negative psych ROS   GI/Hepatic negative GI ROS, Neg liver ROS, GERD  ,  Endo/Other  negative endocrine ROSdiabetes, Gestational, Oral Hypoglycemic Agents  Renal/GU negative Renal ROS  negative genitourinary   Musculoskeletal negative musculoskeletal ROS (+)   Abdominal Normal abdominal exam  (+)   Peds negative pediatric ROS (+)  Hematology negative hematology ROS (+)   Anesthesia Other Findings   Reproductive/Obstetrics negative OB ROS (+) Pregnancy                             Anesthesia Physical  Anesthesia Plan  ASA: II  Anesthesia Plan: Spinal   Post-op Pain Management:    Induction: Intravenous  PONV Risk Score and Plan: 2 and Ondansetron and Treatment may vary due to age or medical condition  Airway Management Planned: Natural Airway  Additional Equipment:   Intra-op Plan:   Post-operative Plan:   Informed Consent: I have reviewed the patients History and Physical, chart, labs and discussed the procedure including the risks, benefits and alternatives for the proposed anesthesia with the patient or authorized representative who has indicated his/her understanding and acceptance.   Dental advisory given  Plan Discussed with: CRNA  Anesthesia Plan Comments:         Anesthesia Quick Evaluation

## 2017-10-25 NOTE — Anesthesia Postprocedure Evaluation (Signed)
Anesthesia Post Note  Patient: Theresa Avery  Procedure(s) Performed: CESAREAN SECTION (N/A Abdomen)     Patient location during evaluation: Mother Baby Anesthesia Type: Spinal Level of consciousness: awake and alert Pain management: pain level controlled Vital Signs Assessment: post-procedure vital signs reviewed and stable Respiratory status: spontaneous breathing Cardiovascular status: blood pressure returned to baseline Postop Assessment: no headache, spinal receding, patient able to bend at knees and no apparent nausea or vomiting Anesthetic complications: no Comments: Interpreter used for interview.  Pt demonstrates ability to bend knees approximately 90 degrees. Reports pain score 1.    Last Vitals:  Vitals:   10/25/17 1631 10/25/17 1730  BP: (!) 93/56 103/61  Pulse: 64 65  Resp: 16 16  Temp: (!) 36.4 C (!) 36.4 C  SpO2: 100% 99%    Last Pain:  Vitals:   10/25/17 1730  TempSrc: Oral   Pain Goal: Patients Stated Pain Goal: 4 (10/25/17 [id number])               Merrilyn PumaWRINKLE,Aasiya Creasey

## 2017-10-25 NOTE — Op Note (Signed)
Operative Note   SURGERY DATE: 10/25/2017  PRE-OP DIAGNOSIS:  *Pregnancy at 39/0 *History of cesarean section x 2 *GDMa2 (glyburide) *Advanced maternal age  POST-OP DIAGNOSIS: Same   PROCEDURE: repeat low transverse cesarean section via pfannenstiel skin incision with double layer uterine closure  SURGEON: Surgeon(s) and Role:    [place] Bing* Breely Panik, MD - Primary  ASSISTANT: Candelaria CelesteJacob Stinson, DO  ANESTHESIA: spinal  ESTIMATED BLOOD LOSS: 900mL  DRAINS: 50mL UOP via indwelling foley  TOTAL IV FLUIDS: 2200mL crystalloid  VTE PROPHYLAXIS: SCDs to bilateral lower extremities  ANTIBIOTICS: Two grams of Cefazolin were given., within 1 hour of skin incision  SPECIMENS: none  COMPLICATIONS: none  FINDINGS: Of note, there were no intra-abdominal adhesions were noted. Grossly normal uterus, tubes and ovaries. Clear amniotic fluid, cephalic female infant, weight 3210gm, APGARs 9/9, intact placenta.  PROCEDURE IN DETAIL: The patient was taken to the operating room where anesthesia was administered and normal fetal heart tones were confirmed. She was then prepped and draped in the normal fashion in the dorsal supine position with a leftward tilt.  After a time out was performed, a pfannensteil  skin incision was made with the scalpel and carried through to the underlying layer of fascia. The fascia was then incised at the midline and this incision was extended laterally with the mayo scissors. Attention was turned to the superior aspect of the fascial incision which was grasped with the kocher clamps x 2, tented up and the rectus muscles were dissected off with the scalpel. In a similar fashion the inferior aspect of the fascial incision was grasped with the kocher clamps, tented up and the rectus muscles dissected off with the mayo scissors. The rectus muscles were then separated in the midline and the peritoneum was entered bluntly; the alexis retractor was then placed and  the vesicouterine  peritoneum was identified, tented up and entered with the metzenbaum scissors. This incision was extended laterally and the bladder flap was created digitally.  A low transverse hysterotomy was made with the scalpel until the endometrial cavity was breached and the amniotic sac ruptured with the Allis clamp, yielding clear amniotic fluid. This incision was extended bluntly and the infant's head, shoulders and body were delivered atraumatically.The cord was clamped x 2 and cut, and the infant was handed to the awaiting pediatricians, after delayed cord clamping was done.  The placenta was then gradually expressed from the uterus and then the uterus was exteriorized and cleared of all clots and debris. The hysterotomy was repaired with a running suture of 1-0 monocryl. A second imbricating layer of 1-0 monocryl suture was then placed. One figure-of-eight suture of 1-0 monocryl in the midline of the hysterotomy were added to achieve excellent hemostasis.   The uterus and adnexa were then returned to the abdomen, and the hysterotomy and all operative sites were reinspected and excellent hemostasis was noted after irrigation and suction of the abdomen with warm saline.  The fascia was reapproximated with 0 Vicryl in a simple running fashion from left to right. The subcutaneous layer was then reapproximated with interrupted sutures of 2-0 plain gut, and the skin was then closed with 4-0 monocryl, in a subcuticular fashion.  The patient  tolerated the procedure well. Sponge, lap, needle, and instrument counts were correct x 2. The patient was transferred to the recovery room awake, alert and breathing independently in stable condition.  Cornelia Copaharlie Keshawn Fiorito, Jr. MD Attending Center [facility](Faculty Practice)

## 2017-10-26 LAB — CBC
HCT: 30.7 % — ABNORMAL LOW (ref 36.0–46.0)
Hemoglobin: 10 g/dL — ABNORMAL LOW (ref 12.0–15.0)
MCH: 29.4 pg (ref 26.0–34.0)
MCHC: 32.6 g/dL (ref 30.0–36.0)
MCV: 90.3 fL (ref 78.0–100.0)
PLATELETS: 140 10*3/uL — AB (ref 150–400)
RBC: 3.4 MIL/uL — ABNORMAL LOW (ref 3.87–5.11)
RDW: 15.1 % (ref 11.5–15.5)
WBC: 6.1 10*3/uL (ref 4.0–10.5)

## 2017-10-26 LAB — GLUCOSE, CAPILLARY: GLUCOSE-CAPILLARY: 149 mg/dL — AB (ref 65–99)

## 2017-10-26 MED ORDER — LACTATED RINGERS IV BOLUS (SEPSIS): 500.0000 mL | Freq: Once | INTRAVENOUS | Status: DC

## 2017-10-26 NOTE — Progress Notes (Signed)
POSTPARTUM PROGRESS NOTE  Post Partum Day 1 Subjective:  Theresa Avery is a 36 y.o. G3P3003 964w0d s/p LTCS.  No acute events overnight.  Pt reports problems with voiding.  But can walk/eat.  She denies nausea or vomiting.  Pain is moderately controlled.  She has had flatus. She has not had bowel movement.  Lochia Minimal.   Complaining of urinary retention, minimal flow and feeling of pressure remaining after voiding  Objective: Blood pressure 113/64, pulse 69, temperature 97.8 F (36.6 C), temperature source Oral, resp. rate 18, height 4\' 9"  (1.448 m), weight 74.8 kg (165 lb), last menstrual period 01/13/2017, SpO2 100 %, unknown if currently breastfeeding.  Had 1000cc drawn by straight cath earlier today after attempt to void per nursing note  Physical Exam:  General: alert, cooperative and no distress Lochia:normal flow Chest: no respiratory distress Heart:regular rate, distal pulses intact Abdomen: soft, nontender,  Uterine Fundus: firm, appropriately tender DVT Evaluation: No calf swelling or tenderness Extremities: no edema  Recent Labs    10/24/17 0950 10/26/17 0508  HGB 13.1 10.0*  HCT 39.4 30.7*    Assessment/Plan:  ASSESSMENT: Theresa Avery is a 36 y.o. G3P3003 5064w0d s/p LTCS  Plan for discharge tomorrow or 12/17, ordering repeat pvr and will cath if necessary   LOS: 1 day   Scott BlandDO 10/26/2017, 10:29 AM   OB FELLOW POSTPARTUM PROGRESS NOTE ATTESTATION  I have seen and examined this patient and agree with above documentation in the resident's note.   Frederik PearJulie P Degele, MD OB Fellow

## 2017-10-26 NOTE — Progress Notes (Signed)
RN went to complete pt 4 hour check at 0630, VS were WDL (pt's BP run low), fundus was 3 above and deviated to the right. RN tried to encourage pt to void, but pt could not do so. RN then bladder scanned pt and discovered that 726 cc was present in the bladder. RN then in and out cath pt. RN collected 1050 cc from pt bladder. Rechecked.

## 2017-10-26 NOTE — Lactation Note (Signed)
This note was copied from a baby's chart. Lactation Consultation Note  Patient Name: Girl Earnest BaileySeng Bu Wootan ZOXWR'UToday's Date: 10/26/2017   Baby at 25 hr of life. Upon entry mom was in the bathroom and baby was sleeping in the basinet. Left handouts with Dad and instructions for mom to call for lactation at the next bf.   Rulon Eisenmengerlizabeth E Dayana Dalporto 10/26/2017, 1:38 PM

## 2017-10-26 NOTE — Progress Notes (Signed)
Pt voided 300cc. Bladder scanned afterwards and received 119cc residual. Updated Dr. Rachelle HoraMoss. No new orders at this time.

## 2017-10-27 MED ORDER — IBUPROFEN 600 MG PO TABS: 600.0000 mg | ORAL_TABLET | Freq: Four times a day (QID) | ORAL | 0 refills | Status: AC

## 2017-10-27 NOTE — Lactation Note (Signed)
This note was copied from a baby's chart. Lactation Consultation Note Baby 37 hrs old. Mom has been mainly formula feeding. When LC attempts to consult, mom is in BR. Mom is resting, spoke w/FOB, asking him to get mom to call when it is time for baby's next feeding. LC wants to speak w/mom about BF. FOB stated mom isn't going to BF until she gets home because she doesn't have milk yet. Explained to FOB colostrum comes first and is important for the baby. Explained to FOB, staffs wants to help mom BF here so when she goes home she is comfortable BF and can assist w/any questions. FOB stated they have 2 other children mom BF 3 yr old for 2 yrs and 1  Yr old for 1 yr. Encouraged FOB to call staff for assistance for next feeding please.  Patient Name: Theresa Avery ZOXWR'UToday's Date: 10/27/2017 Reason for consult: Follow-up assessment   Maternal Data    Feeding Feeding Type: Bottle Fed - Formula  LATCH Score                   Interventions    Lactation Tools Discussed/Used     Consult Status Consult Status: Follow-up Date: 10/27/17 Follow-up type: In-patient    Kade Demicco, Diamond NickelLAURA G 10/27/2017, 1:26 AM

## 2017-10-27 NOTE — Discharge Summary (Signed)
OB Discharge Summary     Patient Name: Theresa Avery DOB: [birth year] MRN: [id number] Date of admission: 10/25/2017  Delivering MD: Aletha Halim )  Date of discharge: 10/27/2017    Admitting diagnosis: cesarean section Intrauterine pregnancy: [redacted]w[redacted]d   Secondary diagnosis:  Active Problems:   Patient Active Problem List   Diagnosis Date Noted  . History of cesarean delivery 10/25/2017  . Abnormal ultrasonic finding on antenatal screening of mother, antepartum 10/09/2017  . Supervision of high risk pregnancy, antepartum 09/19/2017  . Gestational diabetes mellitus (GDM) affecting pregnancy, antepartum 09/19/2017  . Velamentous insertion of umbilical cord, antepartum 09/19/2017  . Previous cesarean section complicating pregnancy, antepartum condition or complication 147/07/6282 . Advanced maternal age in multigravida 05/28/2017    Additional problems: none     Discharge diagnosis: Term Pregnancy Delivered and GDM A2                                                                                                Post partum procedures:none  Augmentation: none  Complications: None  Hospital course:  Sceduled C/S   36y.o. yo G3P3003 at 333w0das admitted to the hospital 10/25/2017 for scheduled cesarean section with the following indication:Elective Repeat.  Membrane Rupture Time/Date: 12:23 PM ,10/25/2017   Patient delivered a Viable infant.10/25/2017  Details of operation can be found in separate operative note.  Postpartum course complicated by urinary retention for which she had to have straight catheter x 2. On day of discharge, patient is urinating without issue or pain.  She is ambulating, tolerating a regular diet, passing flatus, and urinating well. Patient is discharged home in stable condition on  10/27/17         Physical exam  Vitals:   10/26/17 1826 10/27/17 0555  BP: 107/68 104/68  Pulse: 80 74  Resp: 18 18  Temp: 98.5 F (36.9 C) 98.1 F (36.7 C)  SpO2:       General: alert, cooperative and no distress Lochia: appropriate Uterine Fundus: firm Incision: Healing well with no significant drainage, No significant erythema, Dressing is clean, dry, and intact DVT Evaluation: No evidence of DVT seen on physical exam.  Labs: Results for orders placed or performed during the hospital encounter of 10/25/17 (from the past 24 hour(s))  Glucose, capillary     Status: Abnormal   Collection Time: 10/26/17  7:48 AM  Result Value Ref Range   Glucose-Capillary 149 (H) 65 - 99 mg/dL     Discharge instruction: per After Visit Summary and "Baby and Me Booklet".  After visit meds:  No Known Allergies  Allergies as of 10/27/2017   No Known Allergies     Medication List    STOP taking these medications   ACCU-CHEK FASTCLIX LANCETS Misc   ACCU-CHEK GUIDE w/Device Kit   glucose blood test strip Commonly known as:  ACCU-CHEK GUIDE   glyBURIDE 2.5 MG tablet Commonly known as:  DIABETA     TAKE these medications   ibuprofen 600 MG tablet Commonly known as:  ADVIL,MOTRIN Take 1 tablet (600 mg total) by mouth every 6 (six) hours.  prenatal vitamin w/FE, FA 27-1 MG Tabs tablet Take 1 tablet by mouth daily.        Diet: routine diet  Activity: Advance as tolerated. Pelvic rest for 6 weeks.   Outpatient follow up:2 weeks for incision check and 6 weeks for repeat GTT Future Appointments:  Future Appointments  Date Time Provider [place]  11/07/2017  1:20 PM Aletha Halim, MD [facility]    Follow up Appt: No Follow-up on file.    Newborn Data: APGAR (1 MIN): 9   APGAR (5 MINS): 9      Baby Feeding: Breast Disposition:home with mother  Dannielle Huh, DO  10/27/2017

## 2017-11-04 ENCOUNTER — Other Ambulatory Visit: Payer: Self-pay | Admitting: Family Medicine

## 2017-11-07 ENCOUNTER — Ambulatory Visit (INDEPENDENT_AMBULATORY_CARE_PROVIDER_SITE_OTHER): Payer: Medicaid Other | Admitting: Obstetrics and Gynecology

## 2017-11-07 ENCOUNTER — Encounter: Payer: Self-pay | Admitting: Obstetrics and Gynecology

## 2017-11-07 DIAGNOSIS — Z789 Other specified health status: Secondary | ICD-10-CM

## 2017-11-07 DIAGNOSIS — Z603 Acculturation difficulty: Secondary | ICD-10-CM | POA: Insufficient documentation

## 2017-11-07 MED ORDER — ACETAMINOPHEN 500 MG PO TABS: 500.0000 mg | ORAL_TABLET | ORAL | 0 refills | Status: AC | PRN

## 2017-11-07 MED ORDER — IBUPROFEN 600 MG PO TABS: 600.0000 mg | ORAL_TABLET | Freq: Four times a day (QID) | ORAL | 0 refills | Status: DC | PRN

## 2017-11-07 MED ORDER — OXYCODONE HCL 5 MG PO CAPS: 5.0000 mg | ORAL_CAPSULE | Freq: Four times a day (QID) | ORAL | 0 refills | Status: DC | PRN

## 2017-11-07 NOTE — Progress Notes (Signed)
Obstetrics Visit Postpartum Visit  Appointment Date: 11/07/2017  [facility]  Primary Care Provider: Patient, No Pcp Per  Chief Complaint:  Chief Complaint  Patient presents with  . Follow-up    History of Present Illness: Theresa Avery is a 36 y.o. Asian [id number] (Patient's last menstrual period was 01/13/2017.), seen for the above chief complaint. Her past medical history is significant for GDMa1, AMA   She is s/p scheduled repeat c-section on 12-14; she was discharged to home on POD#2  Continuing to meet all PP/post op goals. She was only sent home on OTC meds and sometimes still has pain and is wondering if she can have something PRN to help  Review of Systems: as noted in the History of Present Illness.  Medications [name] had no medications administered during this visit. Current Outpatient Medications  Medication Sig Dispense Refill  . ibuprofen (ADVIL,MOTRIN) 600 MG tablet Take 1 tablet (600 mg total) by mouth every 6 (six) hours. 30 tablet 0  . prenatal vitamin w/FE, FA (PRENATAL 1 + 1) 27-1 MG TABS Take 1 tablet by mouth daily.      Marland Kitchen. acetaminophen (TYLENOL) 500 MG tablet Take 1 tablet (500 mg total) by mouth every 4 (four) hours as needed. 30 tablet 0  . ibuprofen (ADVIL,MOTRIN) 600 MG tablet Take 1 tablet (600 mg total) by mouth every 6 (six) hours as needed. 30 tablet 0  . oxycodone (OXY-IR) 5 MG capsule Take 1 capsule (5 mg total) by mouth every 6 (six) hours as needed. 15 capsule 0   No current facility-administered medications for this visit.     Allergies Patient has no known allergies.  Physical Exam:  BP 123/83   Pulse 65   Wt 153 lb (69.4 kg)   LMP 01/13/2017   Breastfeeding? Yes   BMI 33.11 kg/m  Body mass index is 33.11 kg/m. General appearance: Well nourished, well developed female in no acute distress.  Cardiovascular: normal s1 and s2.  No murmurs, rubs or gallops. Respiratory:  Clear to auscultation  bilateral. Normal respiratory effort Abdomen: positive bowel sounds and no masses, hernias; diffusely non tender to palpation, non distended. Well healed low transverse skin incision Neuro/Psych:  Normal mood and affect.  Skin:  Warm and dry.   Laboratory: none  PP Depression Screening:  2  Assessment: pt doing well  Plan:  D/w her to continue with OTCs. Oxycodone 5mg  #15 given. Pt told to need to repeat 2hr at regular PP visit. Not [facility].   Interpreter used   RTC 4-5wks for rpt 2hr  Cornelia Copaharlie Jeffery Bachmeier, Jr MD Attending Center for [facility](Faculty Practice)

## 2017-11-07 NOTE — Progress Notes (Signed)
"  May" [id number] Burmese video interpreter used

## 2017-12-10 ENCOUNTER — Other Ambulatory Visit: Payer: Self-pay | Admitting: General Practice

## 2017-12-10 DIAGNOSIS — O24419 Gestational diabetes mellitus in pregnancy, unspecified control: Secondary | ICD-10-CM

## 2017-12-11 ENCOUNTER — Other Ambulatory Visit: Payer: Medicaid Other

## 2017-12-11 DIAGNOSIS — O24419 Gestational diabetes mellitus in pregnancy, unspecified control: Secondary | ICD-10-CM

## 2017-12-12 ENCOUNTER — Encounter: Payer: Self-pay | Admitting: Obstetrics and Gynecology

## 2017-12-12 ENCOUNTER — Ambulatory Visit (INDEPENDENT_AMBULATORY_CARE_PROVIDER_SITE_OTHER): Payer: Medicaid Other | Admitting: Obstetrics and Gynecology

## 2017-12-12 DIAGNOSIS — R7303 Prediabetes: Secondary | ICD-10-CM

## 2017-12-12 LAB — GLUCOSE TOLERANCE, 2 HOURS
GLUCOSE, 2 HOUR: 149 mg/dL — AB (ref 65–139)
Glucose, GTT - Fasting: 80 mg/dL (ref 65–99)

## 2017-12-12 NOTE — Progress Notes (Signed)
Obstetrics Visit Postpartum Visit  Appointment Date: 12/12/2017  OB[facility] Clinic[facility]  Primary Care Provider: Patient, No Pcp Per  Chief Complaint:  Chief Complaint  Patient presents with  . Postpartum Care    History of Present Illness: Theresa Avery is a 37 y.o. Asian [id number] (Patient's last menstrual period was 01/13/2017.), seen for the above chief complaint. Her past medical history is significant for GDM, h/o c-section   She is s/p rLTCS on 12/14; she was discharged to home on POD#2  Vaginal bleeding or discharge: No  Breast or formula feeding: breast only Contraception after delivery: condoms PP depression s/s: No  Any bowel or bladder issues: No  Pap smear: no abnormalities (date: 2018)  Review of Systems: as noted in the History of Present Illness.  Medications Theresa Avery had no medications administered during this visit. Current Outpatient Medications  Medication Sig Dispense Refill  . acetaminophen (TYLENOL) 500 MG tablet Take 1 tablet (500 mg total) by mouth every 4 (four) hours as needed. 30 tablet 0  . ibuprofen (ADVIL,MOTRIN) 600 MG tablet Take 1 tablet (600 mg total) by mouth every 6 (six) hours. 30 tablet 0  . ibuprofen (ADVIL,MOTRIN) 600 MG tablet Take 1 tablet (600 mg total) by mouth every 6 (six) hours as needed. 30 tablet 0  . oxycodone (OXY-IR) 5 MG capsule Take 1 capsule (5 mg total) by mouth every 6 (six) hours as needed. 15 capsule 0  . prenatal vitamin w/FE, FA (PRENATAL 1 + 1) 27-1 MG TABS Take 1 tablet by mouth daily.       No current facility-administered medications for this visit.     Allergies Patient has no known allergies.  Physical Exam:  BP 111/71   Pulse 60   Wt 153 lb (69.4 kg)   LMP 01/13/2017   Breastfeeding? Yes   BMI 33.11 kg/m  Body mass index is 33.11 kg/m. General appearance: Well nourished, well developed female in no acute distress.  Cardiovascular: normal s1 and s2.  No murmurs, rubs or  gallops. Respiratory:  Clear to auscultation bilateral. Normal respiratory effort Abdomen: positive bowel sounds and no masses, hernias; diffusely non tender to palpation, non distended,. Well healed LT skin incision Neuro/Psych:  Normal mood and affect.  Skin:  Warm and dry.   Laboratory: none  PP Depression Screening:  EPDS 2  Assessment: pt doing well  Plan: routine care. D/w pt re: 2hr PP GTT c/w pre-DM and to establish PCP care with HD and will need repeat testing for next year. Pt only would like to do condoms  Interpreter used  RTC PRN  Cornelia Copaharlie Cardin Nitschke, Jr MD Attending [facility](Faculty Practice)

## 2017-12-13 ENCOUNTER — Encounter: Payer: Self-pay | Admitting: Obstetrics and Gynecology

## 2017-12-13 DIAGNOSIS — R7303 Prediabetes: Secondary | ICD-10-CM | POA: Insufficient documentation

## 2018-02-13 ENCOUNTER — Other Ambulatory Visit: Payer: Self-pay | Admitting: Obstetrics & Gynecology

## 2018-08-08 ENCOUNTER — Other Ambulatory Visit: Payer: Self-pay | Admitting: Obstetrics and Gynecology

## 2019-01-27 ENCOUNTER — Other Ambulatory Visit: Payer: Self-pay

## 2019-01-27 ENCOUNTER — Ambulatory Visit (HOSPITAL_COMMUNITY)
Admission: EM | Admit: 2019-01-27 | Discharge: 2019-01-27 | Disposition: A | Discharge status: Home / Self Care | Payer: Medicaid Other | Attending: Family Medicine | Admitting: Family Medicine

## 2019-01-27 ENCOUNTER — Encounter (HOSPITAL_COMMUNITY): Payer: Self-pay

## 2019-01-27 DIAGNOSIS — R05 Cough: Secondary | ICD-10-CM

## 2019-01-27 DIAGNOSIS — M7918 Myalgia, other site: Secondary | ICD-10-CM

## 2019-01-27 DIAGNOSIS — R509 Fever, unspecified: Secondary | ICD-10-CM

## 2019-01-27 DIAGNOSIS — J111 Influenza due to unidentified influenza virus with other respiratory manifestations: Secondary | ICD-10-CM

## 2019-01-27 DIAGNOSIS — R69 Illness, unspecified: Secondary | ICD-10-CM | POA: Insufficient documentation

## 2019-01-27 DIAGNOSIS — J029 Acute pharyngitis, unspecified: Secondary | ICD-10-CM

## 2019-01-27 LAB — INFLUENZA PANEL BY PCR (TYPE A & B)
Influenza A By PCR: POSITIVE — AB
Influenza B By PCR: NEGATIVE

## 2019-01-27 MED ORDER — OSELTAMIVIR PHOSPHATE 75 MG PO CAPS: 75.0000 mg | ORAL_CAPSULE | Freq: Two times a day (BID) | ORAL | 0 refills | Status: DC

## 2019-01-27 NOTE — ED Triage Notes (Signed)
Pt presents with fever since today.

## 2019-01-27 NOTE — ED Triage Notes (Signed)
Pt is also has complaints of general body aches.

## 2019-01-27 NOTE — ED Provider Notes (Signed)
[facility]    CSN: [id number] Arrival date & time: 01/27/19  1535     History   Chief Complaint Chief Complaint  Patient presents with  . Fever    HPI Theresa Avery is a 38 y.o. female.   HPI  Patient is here for a respiratory infection.  Mild sore throat and cough.  Fever and body aches.  Feels very tired.  The spent most of the day in bed.  She has 3 children.  Youngest daughter was just treated for respiratory infection croup and ear infection.  Oldest child had influenza but this was a couple months ago.  Unknown exposure to any other infection.  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 No travel.  No exposure to known coronavirus patient  Past Medical History:  Diagnosis Date  . Anemia    Hx with 1st pregnancy  . Appendicitis    Did not have surgery  . Depression   . GERD (gastroesophageal reflux disease)   . Malaria     Patient Active Problem List   Diagnosis Date Noted  . Prediabetes 12/13/2017  . Language barrier 11/07/2017  . History of cesarean delivery 10/25/2017    Past Surgical History:  Procedure Laterality Date  . APPENDECTOMY    . CESAREAN SECTION    . CESAREAN SECTION  07/12/2011   Procedure: CESAREAN SECTION;  Surgeon: Lazaro Arms, MD;  Location: [facility];  Service: Gynecology;  Laterality: N/A;  . CESAREAN SECTION N/A 10/25/2017   Procedure: CESAREAN SECTION;  Surgeon: [place] Bing, MD;  Location: [facility] SU[facility];  Service: Obstetrics;  Laterality: N/A;    OB History    Gravida  3   Para  3   Term  3   Preterm  0   AB  0   Living  3     SAB  0   TAB  0   Ectopic  0   Multiple  0   Live Births  3            Home Medications    Prior to Admission medications   Medication Sig Start Date End Date Taking? Authorizing Provider  acetaminophen (TYLENOL) 500 MG tablet Take 1 tablet (500 mg total) by mouth every 4 (four) hours as needed. 11/07/17   [place] Bing, MD  ibuprofen (ADVIL,MOTRIN) 600 MG  tablet Take 1 tablet (600 mg total) by mouth every 6 (six) hours. 10/27/17   Moss, Amber, DO  oseltamivir (TAMIFLU) 75 MG capsule Take 1 capsule (75 mg total) by mouth every 12 (twelve) hours. 01/27/19   Eustace Moore, MD  Prenatal Vit-Fe Fumarate-FA (PRENATAL VITAMIN PLUS LOW IRON) 27-1 MG TABS TAKE 1 TABLET BY MOUTH EVERY DAY 08/14/18   Constant, Gigi Gin, MD    Family History History reviewed. No pertinent family history.  Social History Social History   Tobacco Use  . Smoking status: Never Smoker  . Smokeless tobacco: Never Used  Substance Use Topics  . Alcohol use: No  . Drug use: No     Allergies   Patient has no known allergies.   Review of Systems Review of Systems  Constitutional: Positive for fever. Negative for chills.  HENT: Positive for rhinorrhea and sore throat. Negative for ear pain.        Mild respiratory symptoms  Eyes: Negative for pain and visual disturbance.  Respiratory: Positive for cough. Negative for shortness of breath.   Cardiovascular: Negative for chest pain and palpitations.  Gastrointestinal: Negative for abdominal pain and vomiting.  Genitourinary: Negative for dysuria and hematuria.  Musculoskeletal: Positive for myalgias. Negative for arthralgias and back pain.  Skin: Negative for color change and rash.  Neurological: Negative for seizures and syncope.  All other systems reviewed and are negative.    Physical Exam Triage Vital Signs ED Triage Vitals  Enc Vitals Group     BP 01/27/19 1604 110/71     Pulse Rate 01/27/19 1604 (!) 109     Resp 01/27/19 1604 16     Temp 01/27/19 1604 (!) 100.9 F (38.3 C)     Temp Source 01/27/19 1604 Oral     SpO2 01/27/19 1604 98 %     Weight --      Height --      Head Circumference --      Peak Flow --      Pain Score 01/27/19 1606 7     Pain Loc --      Pain Edu? --      Excl. in GC? --    No data found.  Updated Vital Signs BP 110/71 (BP Location: Right Arm)   Pulse (!) 109   Temp  (!) 100.9 F (38.3 C) (Oral)   Resp 16   SpO2 98%      Physical Exam Constitutional:      General: She is not in acute distress.    Appearance: She is well-developed and normal weight.     Comments: Appears very tired.  Moderately ill.  HENT:     Head: Normocephalic and atraumatic.     Right Ear: Tympanic membrane and ear canal normal.     Left Ear: Tympanic membrane and ear canal normal.     Nose: Rhinorrhea present.     Mouth/Throat:     Pharynx: Posterior oropharyngeal erythema present.  Eyes:     Conjunctiva/sclera: Conjunctivae normal.     Pupils: Pupils are equal, round, and reactive to light.  Neck:     Musculoskeletal: Normal range of motion.  Cardiovascular:     Rate and Rhythm: Normal rate and regular rhythm.     Heart sounds: Normal heart sounds.  Pulmonary:     Effort: Pulmonary effort is normal. No respiratory distress.     Breath sounds: Normal breath sounds. No wheezing or rhonchi.  Abdominal:     General: Bowel sounds are normal. There is no distension.     Palpations: Abdomen is soft.     Tenderness: There is no abdominal tenderness.  Musculoskeletal: Normal range of motion.  Skin:    General: Skin is warm and dry.  Neurological:     Mental Status: She is alert.      UC Treatments / Results  Labs (all labs ordered are listed, but only abnormal results are displayed) Labs Reviewed  INFLUENZA PANEL BY PCR (TYPE A & B)    EKG None  Radiology No results found.  Procedures Procedures (including critical care time)  Medications Ordered in UC Medications - No data to display  Initial Impression / Assessment and Plan / UC Course  I have reviewed the triage vital signs and the nursing notes.  Pertinent labs & imaging results that were available during my care of the patient were reviewed by me and considered in my medical decision making (see chart for details).     Influenza-like illness.  They request testing because they are worried about  coronavirus (husband).  I told him that she is at  low risk of coronavirus given her exposure.  Final Clinical Impressions(s) / UC Diagnoses   Final diagnoses:  Influenza-like illness     Discharge Instructions     Take the tamilfu 2 x a day Push fluids Ibuprofen or acetaminophen for pain and fever  We will call with the test result for flu.     ED Prescriptions    Medication Sig Dispense Auth. Provider   oseltamivir (TAMIFLU) 75 MG capsule Take 1 capsule (75 mg total) by mouth every 12 (twelve) hours. 10 capsule Eustace Moore, MD     Controlled Substance Prescriptions [place] Controlled Substance Registry consulted? Not Applicable   Eustace Moore, MD 01/27/19 [phone]

## 2019-01-27 NOTE — Discharge Instructions (Addendum)
Take the tamilfu 2 x a day Push fluids Ibuprofen or acetaminophen for pain and fever  We will call with the test result for flu.

## 2019-01-29 ENCOUNTER — Telehealth (HOSPITAL_COMMUNITY): Payer: Self-pay | Admitting: Emergency Medicine

## 2019-01-29 NOTE — Telephone Encounter (Signed)
Patient contacted and made aware of all results, all questions answered.   

## 2019-02-02 IMAGING — US US OB TRANSVAGINAL
1 series · 14 of 28 positions shown · non-contrast
Comparison: none

[Series 1: us ob transvaginal · 94 acquisitions, 14 frames shown]
[im 4/94]
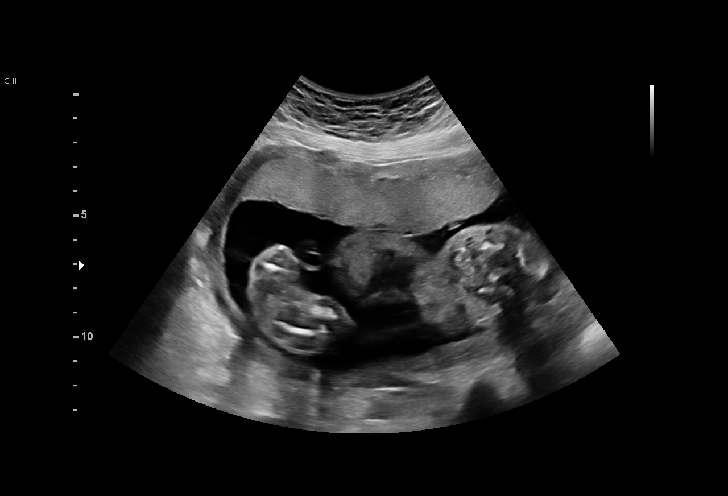
[im 11/94]
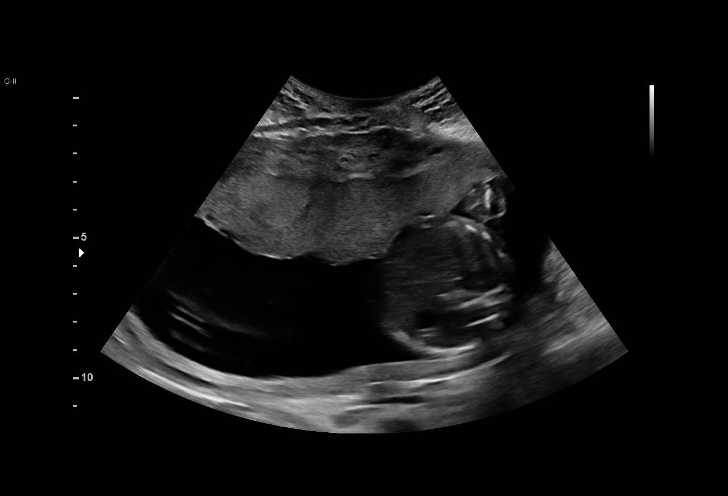
[im 18/94]
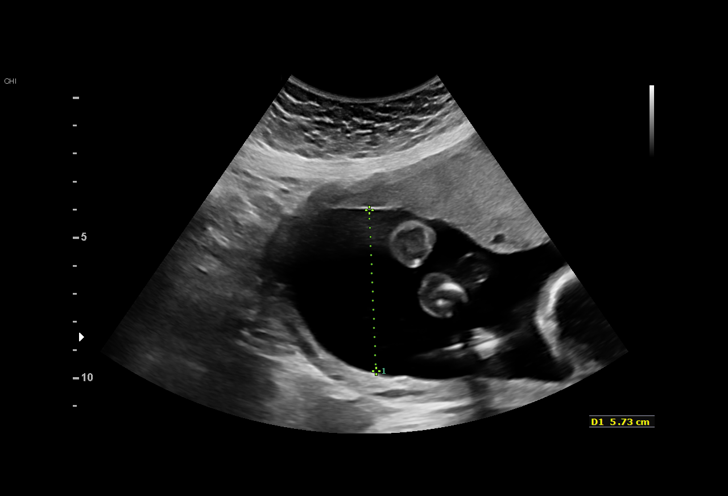
[im 25/94]
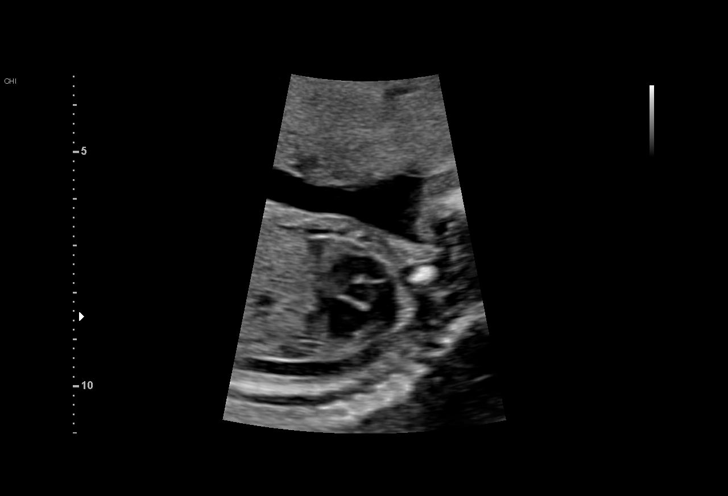
[im 32/94]
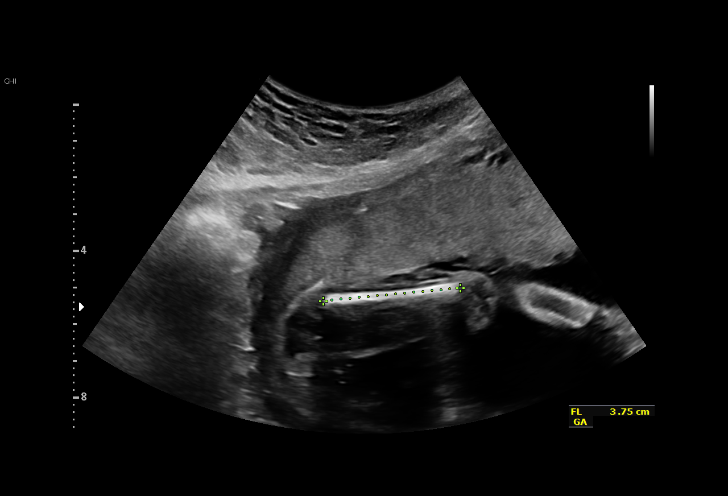
[im 38/94]
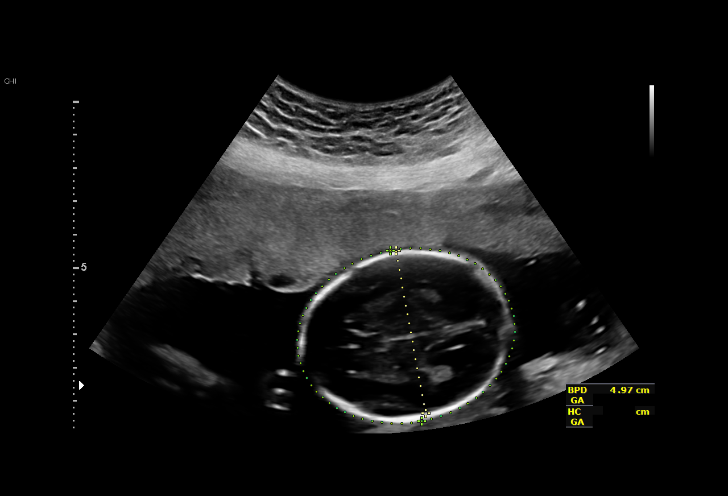
[im 45/94]
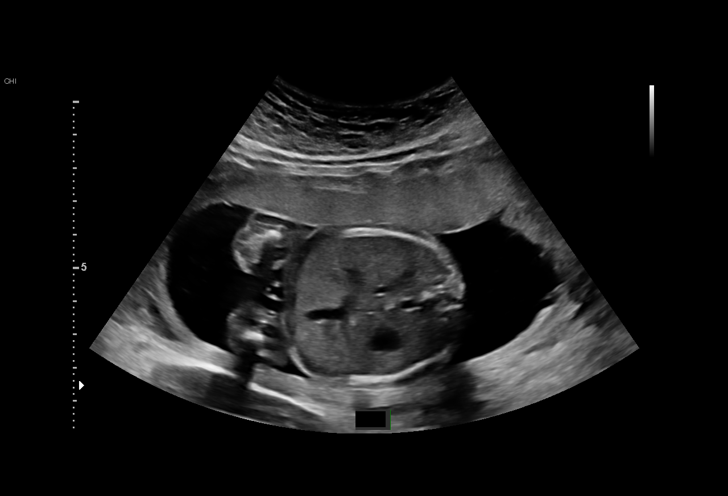
[im 52/94]
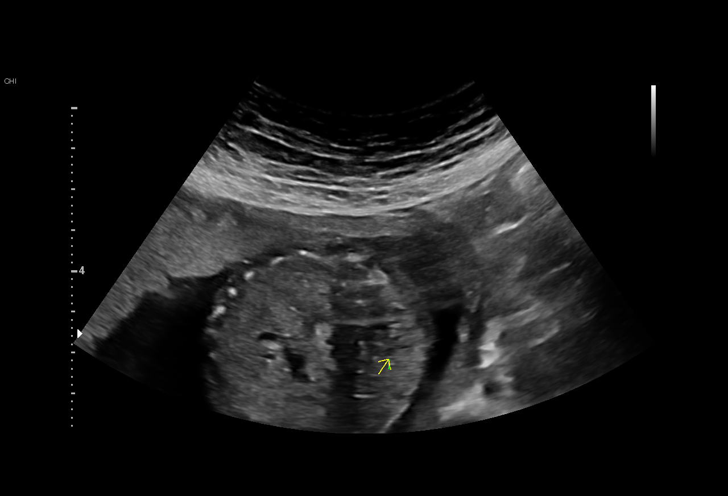
[im 59/94]
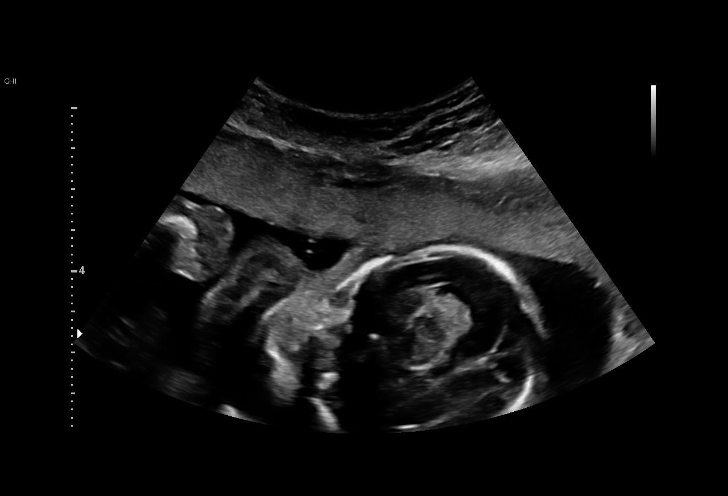
[im 66/94]
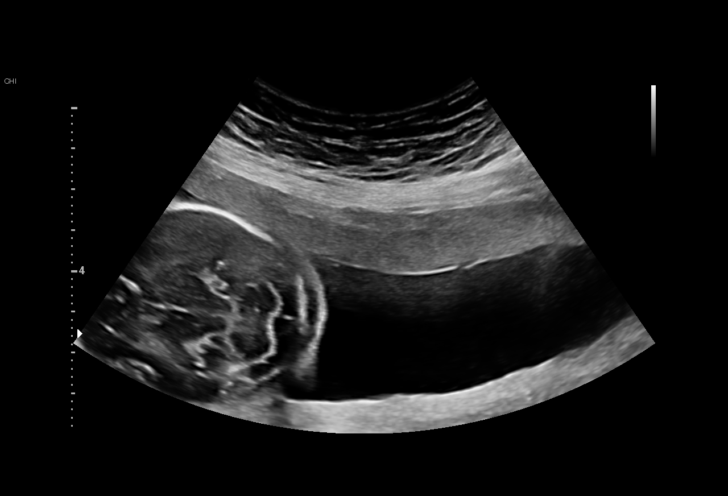
[im 73/94]
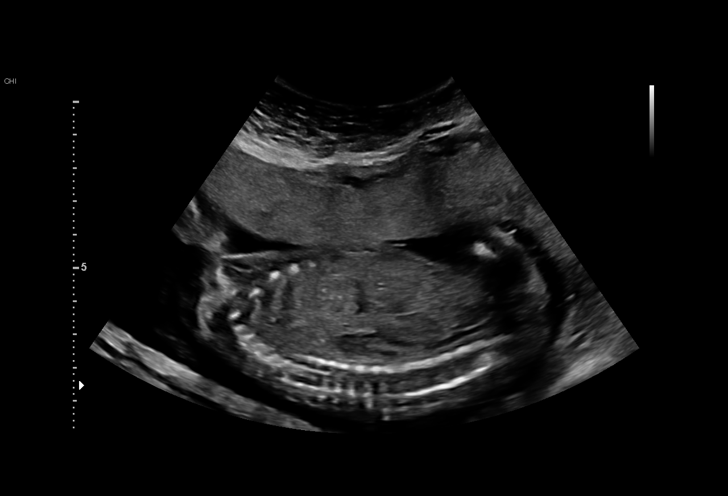
[im 80/94]
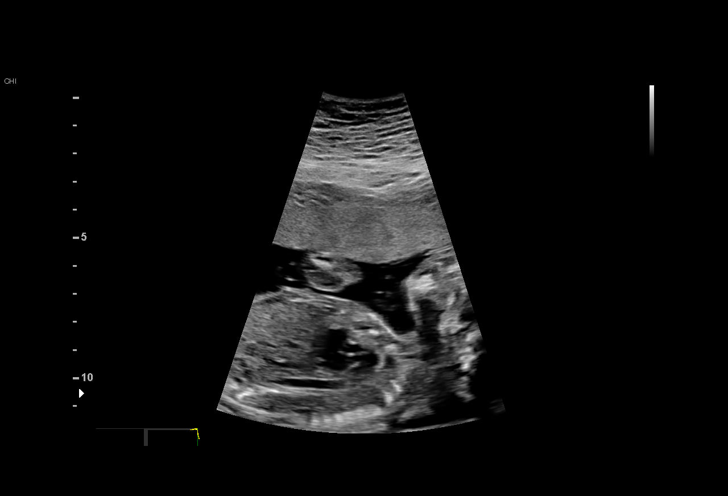
[im 87/94]
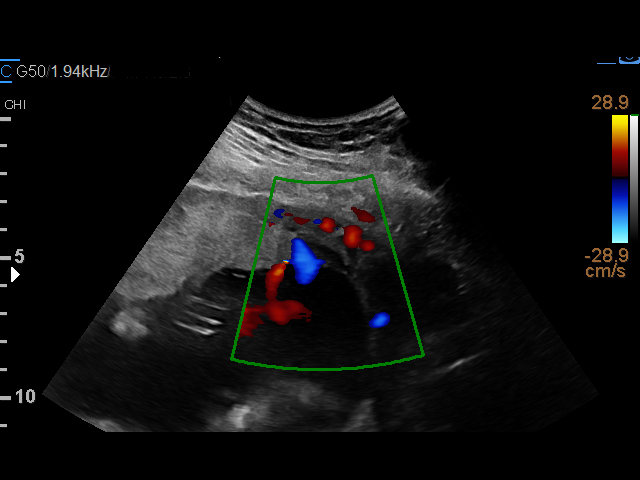
[im 94/94]
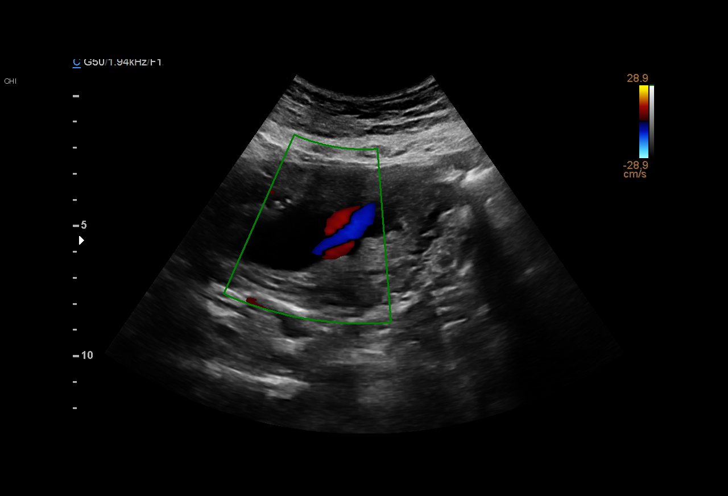

[14 of 28 positions shown; findings below may reference images not displayed]

[REDACTED]-
Faculty Physician

1  US OB TRANSVAGINAL                          76817.0

1  RAMIADEL ROOBLEH            [id number]      [id number]     [id number]
2  RAMIADEL ROOBLEH            [id number]      [id number]     [id number]
Indications

21 weeks gestation of pregnancy
Advanced maternal age multigravida 35+,
second trimester
History of cesarean delivery, currently
pregnant (x2)
OB History

Gravidity:    3         Term:   2
Living:       2
Fetal Evaluation

Num Of Fetuses:     1
Fetal Heart         137
Rate(bpm):
Cardiac Activity:   Observed
Presentation:       Variable
Placenta:           Anterior
P. Cord Insertion:  Marginal vs velmentous

Amniotic Fluid
AFI FV:      Subjectively within normal limits
Largest Pocket(cm)
5.73
Biometry

BPD:      49.8  mm     G. Age:  21w 0d         28  %    CI:        72.86   %   70 - 86
FL/HC:      20.2   %   18.4 -
HC:      185.5  mm     G. Age:  20w 6d         15  %    HC/AC:      1.07       1.06 -
AC:      173.8  mm     G. Age:  22w 2d         67  %    FL/BPD:     75.3   %   71 - 87
FL:       37.5  mm     G. Age:  22w 0d         54  %    FL/AC:      21.6   %   20 - 24
Est. FW:     467  gm          1 lb      53  %
Gestational Age

LMP:           23w 2d       Date:   01/13/17                 EDD:   10/20/17
U/S Today:     21w 4d                                        EDD:   11/01/17
Best:          21w 4d    Det. By:   U/S  (05/28/17)          EDD:   11/01/17
Anatomy

Cranium:               Appears normal         LVOT:                   Appears normal
Cavum:                 Appears normal         Aortic Arch:            Appears normal
Ventricles:            Appears normal         Ductal Arch:            Appears normal
Choroid Plexus:        Appears normal         Diaphragm:              Appears normal
Cerebellum:            Appears normal         Stomach:                Appears normal, left
sided
Posterior Fossa:       Appears normal         Abdomen:                Appears normal
Nuchal Fold:           Appears normal         Abdominal Wall:         Appears nml (cord
insert, abd wall)
Face:                  Orbits and profile     Cord Vessels:           Appears normal (3
previously seen                                vessel cord)
Lips:                  Appears normal         Kidneys:                Appear normal
Palate:                Previously seen        Bladder:                Appears normal
Thoracic:              Appears normal         Spine:                  Not well visualized
Heart:                 Not well visualized    Upper Extremities:      Visualized
RVOT:                  Not well visualized    Lower Extremities:      Visualized

Other:  Open hands previously visualized. Heels previously visualized. Right
5th digit visualized. Technically difficult due to maternal habitus and
fetal position.
Cervix Uterus Adnexa

Cervix
Normal appearance by transabdominal scan.

Comment:      Consistent contraction in lower uterus segment.
Impression

Singleton intrauterine pregnancy at 21+4 weeks with AMA
Review of the anatomy shows no sonographic markers for
aneuploidy or structural anomalies. Previously-noted choroid
plexus cyst is no longer present
However,  cardiac evaluation should be considered
suboptimal secondary to fetal position
Amniotic fluid volume is normal
Estimated fetal weight is 467g which is growth in the 53rd
percentile
Probable velamentous cord insertion
Recommendations

Repeat scan in 4 weeks to complete anatomic survey and
reassess cord insertion

## 2023-09-24 ENCOUNTER — Encounter: Payer: Self-pay | Admitting: Family Medicine

## 2023-09-24 ENCOUNTER — Ambulatory Visit: Payer: Medicaid Other | Admitting: Family Medicine

## 2023-09-24 VITALS — BP 118/84 | HR 62 | Temp 97.8°F | Ht <= 58 in | Wt 146.0 lb

## 2023-09-24 DIAGNOSIS — N939 Abnormal uterine and vaginal bleeding, unspecified: Secondary | ICD-10-CM | POA: Diagnosis not present

## 2023-09-24 DIAGNOSIS — Z0001 Encounter for general adult medical examination with abnormal findings: Secondary | ICD-10-CM

## 2023-09-24 DIAGNOSIS — E66811 Obesity, class 1: Secondary | ICD-10-CM

## 2023-09-24 DIAGNOSIS — Z758 Other problems related to medical facilities and other health care: Secondary | ICD-10-CM

## 2023-09-24 DIAGNOSIS — Z603 Acculturation difficulty: Secondary | ICD-10-CM | POA: Diagnosis not present

## 2023-09-24 DIAGNOSIS — Z23 Encounter for immunization: Secondary | ICD-10-CM

## 2023-09-24 DIAGNOSIS — Z124 Encounter for screening for malignant neoplasm of cervix: Secondary | ICD-10-CM

## 2023-09-24 DIAGNOSIS — Z1159 Encounter for screening for other viral diseases: Secondary | ICD-10-CM

## 2023-09-24 DIAGNOSIS — R7303 Prediabetes: Secondary | ICD-10-CM | POA: Diagnosis not present

## 2023-09-24 DIAGNOSIS — Z8632 Personal history of gestational diabetes: Secondary | ICD-10-CM | POA: Diagnosis not present

## 2023-09-24 LAB — CBC WITH DIFFERENTIAL/PLATELET
Basophils Absolute: 0 10*3/uL (ref 0.0–0.1)
Basophils Relative: 0.3 % (ref 0.0–3.0)
Eosinophils Absolute: 0.1 10*3/uL (ref 0.0–0.7)
Eosinophils Relative: 1.8 % (ref 0.0–5.0)
HCT: 43 % (ref 36.0–46.0)
Hemoglobin: 14.3 g/dL (ref 12.0–15.0)
Lymphocytes Relative: 37.8 % (ref 12.0–46.0)
Lymphs Abs: 2 10*3/uL (ref 0.7–4.0)
MCHC: 33.2 g/dL (ref 30.0–36.0)
MCV: 88.2 fL (ref 78.0–100.0)
Monocytes Absolute: 0.5 10*3/uL (ref 0.1–1.0)
Monocytes Relative: 9.1 % (ref 3.0–12.0)
Neutro Abs: 2.7 10*3/uL (ref 1.4–7.7)
Neutrophils Relative %: 51 % (ref 43.0–77.0)
Platelets: 233 10*3/uL (ref 150.0–400.0)
RBC: 4.88 Mil/uL (ref 3.87–5.11)
RDW: 13.2 % (ref 11.5–15.5)
WBC: 5.3 10*3/uL (ref 4.0–10.5)

## 2023-09-24 LAB — COMPREHENSIVE METABOLIC PANEL
ALT: 14 U/L (ref 0–35)
AST: 17 U/L (ref 0–37)
Albumin: 4.5 g/dL (ref 3.5–5.2)
Alkaline Phosphatase: 62 U/L (ref 39–117)
BUN: 10 mg/dL (ref 6–23)
CO2: 27 meq/L (ref 19–32)
Calcium: 9.8 mg/dL (ref 8.4–10.5)
Chloride: 103 meq/L (ref 96–112)
Creatinine, Ser: 0.67 mg/dL (ref 0.40–1.20)
GFR: 106.75 mL/min (ref >60.00)
Glucose, Bld: 96 mg/dL (ref 70–99)
Potassium: 4.4 meq/L (ref 3.5–5.1)
Sodium: 138 meq/L (ref 135–145)
Total Bilirubin: 0.5 mg/dL (ref 0.2–1.2)
Total Protein: 7.9 g/dL (ref 6.0–8.3)

## 2023-09-24 LAB — TSH: TSH: 1.74 u[IU]/mL (ref 0.35–5.50)

## 2023-09-24 LAB — LIPID PANEL
Cholesterol: 151 mg/dL (ref 0–200)
HDL: 54.9 mg/dL (ref >39.00)
LDL Cholesterol: 82 mg/dL (ref 0–99)
NonHDL: 96.24
Total CHOL/HDL Ratio: 3
Triglycerides: 69 mg/dL (ref 0.0–149.0)
VLDL: 13.8 mg/dL (ref 0.0–40.0)

## 2023-09-24 LAB — T4, FREE: Free T4: 0.84 ng/dL (ref 0.60–1.60)

## 2023-09-24 LAB — HEMOGLOBIN A1C: Hgb A1c MFr Bld: 5.7 % (ref 4.6–6.5)

## 2023-09-24 NOTE — Addendum Note (Signed)
Addended by: Clearnce Sorrel on: 09/24/2023 12:28 PM   Modules accepted: Orders

## 2023-09-24 NOTE — Patient Instructions (Signed)
You will hear from the gynecology office to schedule a visit for your breast and pelvic exams (mammogram and pap smear as well).   We will be in touch with your lab results.

## 2023-09-24 NOTE — Progress Notes (Signed)
New Patient Office Visit  Subjective    Patient ID: Theresa Avery, female    DOB: Jun 10, 1981  Age: 42 y.o. MRN: [id number]  CC:  Chief Complaint  Patient presents with   Establish Care    Wants full body check up, has been over a year since dr    HPI [facility] presents to establish care. She would like to have a CPE with labs. She had a shake earlier.   Medical interpreter on stick used.   OB/GYN- 2019 after delivery of her baby in 2018   Periods are every other month on average.   LMP: 09/19/2023  Uses condoms   Married  Works at [employer]   Outpatient Encounter Medications as of 09/24/2023  Medication Sig   acetaminophen (TYLENOL) 500 MG tablet Take 1 tablet (500 mg total) by mouth every 4 (four) hours as needed.   Cholecalciferol (VITAMIN D-3 PO) Take by mouth.   ibuprofen (ADVIL,MOTRIN) 600 MG tablet Take 1 tablet (600 mg total) by mouth every 6 (six) hours.   [DISCONTINUED] oseltamivir (TAMIFLU) 75 MG capsule Take 1 capsule (75 mg total) by mouth every 12 (twelve) hours.   [DISCONTINUED] Prenatal Vit-Fe Fumarate-FA (PRENATAL VITAMIN PLUS LOW IRON) 27-1 MG TABS TAKE 1 TABLET BY MOUTH EVERY DAY   No facility-administered encounter medications on file as of 09/24/2023.    Past Medical History:  Diagnosis Date   Anemia    Hx with 1st pregnancy   Appendicitis    Did not have surgery   Depression    GERD (gastroesophageal reflux disease)    Malaria     Past Surgical History:  Procedure Laterality Date   APPENDECTOMY     CESAREAN SECTION     CESAREAN SECTION  07/12/2011   Procedure: CESAREAN SECTION;  Surgeon: Lazaro Arms, MD;  Location: [facility];  Service: Gynecology;  Laterality: N/A;   CESAREAN SECTION N/A 10/25/2017   Procedure: CESAREAN SECTION;  Surgeon: [place] Bing, MD;  Location: [facility];  Service: Obstetrics;  Laterality: N/A;    History reviewed. No pertinent family history.  Social History   Socioeconomic History    Marital status: Married    Spouse name: Not on file   Number of children: Not on file   Years of education: Not on file   Highest education level: Not on file  Occupational History   Not on file  Tobacco Use   Smoking status: Never   Smokeless tobacco: Never  Substance and Sexual Activity   Alcohol use: No   Drug use: No   Sexual activity: Yes    Birth control/protection: Condom  Other Topics Concern   Not on file  Social History Narrative   Not on file   Social Determinants of Health   Financial Resource Strain: Not on file  Food Insecurity: Not on file  Transportation Needs: Not on file  Physical Activity: Not on file  Stress: Not on file  Social Connections: Not on file  Intimate Partner Violence: Not on file    Review of Systems  Constitutional:  Negative for chills, fever, malaise/fatigue and weight loss.  HENT:  Negative for congestion, ear pain, sinus pain and sore throat.   Eyes:  Negative for blurred vision, double vision and pain.  Respiratory:  Negative for cough, shortness of breath and wheezing.   Cardiovascular:  Negative for chest pain, palpitations and leg swelling.  Gastrointestinal:  Negative for abdominal pain, constipation, diarrhea,  nausea and vomiting.  Genitourinary:  Negative for dysuria, frequency and urgency.  Musculoskeletal:  Negative for back pain, joint pain and myalgias.  Skin:  Negative for rash.  Neurological:  Negative for dizziness, tingling, focal weakness and headaches.  Psychiatric/Behavioral:  Negative for depression. The patient is not nervous/anxious.         Objective    BP 118/84 (BP Location: Left Arm, Patient Position: Sitting, Cuff Size: Normal)   Pulse 62   Temp 97.8 F (36.6 C) (Temporal)   Ht 4\' 9"  (1.448 m)   Wt 146 lb (66.2 kg)   SpO2 98%   Breastfeeding No   BMI 31.59 kg/m   Physical Exam Constitutional:      General: She is not in acute distress.    Appearance: She is not ill-appearing.  HENT:      Right Ear: Tympanic membrane, ear canal and external ear normal.     Left Ear: Tympanic membrane, ear canal and external ear normal.     Nose: Nose normal.     Mouth/Throat:     Mouth: Mucous membranes are moist.     Pharynx: Oropharynx is clear.  Eyes:     Extraocular Movements: Extraocular movements intact.     Conjunctiva/sclera: Conjunctivae normal.     Pupils: Pupils are equal, round, and reactive to light.  Neck:     Thyroid: No thyroid mass, thyromegaly or thyroid tenderness.  Cardiovascular:     Rate and Rhythm: Normal rate and regular rhythm.     Pulses: Normal pulses.     Heart sounds: Normal heart sounds.  Pulmonary:     Effort: Pulmonary effort is normal.     Breath sounds: Normal breath sounds.  Abdominal:     General: Bowel sounds are normal.     Palpations: Abdomen is soft.     Tenderness: There is no abdominal tenderness. There is no right CVA tenderness, left CVA tenderness, guarding or rebound.  Musculoskeletal:        General: Normal range of motion.     Cervical back: Normal range of motion and neck supple. No tenderness.     Right lower leg: No edema.     Left lower leg: No edema.  Lymphadenopathy:     Cervical: No cervical adenopathy.  Skin:    General: Skin is warm and dry.     Findings: No lesion or rash.  Neurological:     General: No focal deficit present.     Mental Status: She is alert and oriented to person, place, and time.     Cranial Nerves: No cranial nerve deficit.     Sensory: No sensory deficit.     Motor: No weakness.     Gait: Gait normal.  Psychiatric:        Mood and Affect: Mood normal.        Behavior: Behavior normal.        Thought Content: Thought content normal.         Assessment & Plan:   Problem List Items Addressed This Visit       Other   Language barrier   Prediabetes   Relevant Orders   CBC with Differential/Platelet   Comprehensive metabolic panel   Hemoglobin A1c   Other Visit Diagnoses      Encounter for general adult medical examination with abnormal findings    -  Primary   History of gestational diabetes       Relevant Orders   Hemoglobin  A1c   Abnormal uterine bleeding (AUB)       Relevant Orders   Ambulatory referral to Gynecology   CBC with Differential/Platelet   Comprehensive metabolic panel   TSH   T4, free   Pap smear for cervical cancer screening       Relevant Orders   Ambulatory referral to Gynecology   Encounter for screening for other viral diseases       Relevant Orders   Hepatitis C antibody   Obesity (BMI 30.0-34.9)       Relevant Orders   CBC with Differential/Platelet   Comprehensive metabolic panel   Hemoglobin A1c   TSH   Lipid panel   T4, free      She is a pleasant 42 year old female who is new to the practice and here to establish care.  CPE done.  Referred to OB/GYN. Preventive health care reviewed.  Counseling on healthy lifestyle including diet and exercise.  Recommend regular dental and eye exams.  Immunizations reviewed.  Discussed safety. Follow-up pending lab results   Return for pending labs.   Hetty Blend, NP-C

## 2023-09-25 NOTE — Progress Notes (Signed)
Her blood work is all fine. Nothing concerning. I see the lab is attempting to get her to come back to get the hepatitis C blood test. It was missed on the blood draw yesterday.

## 2023-11-29 ENCOUNTER — Encounter: Payer: Self-pay | Admitting: Radiology

## 2023-11-29 ENCOUNTER — Ambulatory Visit: Payer: Medicaid Other | Admitting: Radiology

## 2023-11-29 VITALS — BP 126/82 | HR 72 | Ht <= 58 in | Wt 149.0 lb

## 2023-11-29 DIAGNOSIS — Z30011 Encounter for initial prescription of contraceptive pills: Secondary | ICD-10-CM | POA: Diagnosis not present

## 2023-11-29 DIAGNOSIS — N939 Abnormal uterine and vaginal bleeding, unspecified: Secondary | ICD-10-CM

## 2023-11-29 LAB — PREGNANCY, URINE: Preg Test, Ur: NEGATIVE

## 2023-11-29 MED ORDER — TYBLUME 0.1-20 MG-MCG PO CHEW: 1.0000 | CHEWABLE_TABLET | Freq: Every day | ORAL | 0 refills | Status: DC

## 2023-11-29 NOTE — Progress Notes (Signed)
Theresa Avery 1980/12/28 [id number]   History:  43 y.o. G3P3 presents as a new patient, with [place] video interpreter. Referred by PCP for skipping periods. This has been the case since she had malaria in 2000. Has a cycle every other month, never skips more than 1 month at a time. No trouble conceiving. Using condoms for [facility]. Has anemia. No other gyn concerns.   Gynecologic History Patient's last menstrual period was 11/11/2023 (approximate). Period Duration (Days): 3 Period Pattern: (!) Irregular Menstrual Flow: Light Menstrual Control: Thin pad Dysmenorrhea: None Contraception/Family planning: condoms Sexually active: yes Last Pap: ?2019. Results were: normal Last mammogram: never  Obstetric History OB History  Gravida Para Term Preterm AB Living  3 3 3  0 0 3  SAB IAB Ectopic Multiple Live Births  0 0 0 0 3    # Outcome Date GA Lbr Len/2nd Weight Sex Type Anes PTL Lv  3 Term 10/25/17 [redacted]w[redacted]d  7 lb 1.2 oz (3.21 kg) F CS-LTranv Spinal  LIV  2 Term 07/12/11 [redacted]w[redacted]d  8 lb 0.6 oz (3.645 kg) M CS-Vac EPI  LIV  1 Term 01/22/09    M CS-LTranv EPI N LIV    The following portions of the patient's history were reviewed and updated as appropriate: allergies, current medications, past family history, past medical history, past social history, past surgical history, and problem list.  Review of Systems  All other systems reviewed and are negative.   Past medical history, past surgical history, family history and social history were all reviewed and documented in the EPIC chart.  Exam:  Vitals:   11/29/23 0905  BP: 126/82  Pulse: 72  SpO2: 98%  Weight: 149 lb (67.6 kg)  Height: 4\' 10"  (1.473 m)   Body mass index is 31.14 kg/m.  Physical Exam Vitals reviewed.  Constitutional:      Appearance: Normal appearance. She is overweight.  Cardiovascular:     Pulses: Normal pulses.     Heart sounds: Normal heart sounds.  Pulmonary:     Effort: Pulmonary effort is normal.      Breath sounds: Normal breath sounds.  Abdominal:     General: Abdomen is flat. Bowel sounds are normal.     Palpations: Abdomen is soft.     Tenderness: There is no abdominal tenderness.  Genitourinary:    General: Normal vulva.     Vagina: Normal.     Cervix: Normal.     Uterus: Normal.      Adnexa: Right adnexa normal and left adnexa normal.  Skin:    General: Skin is warm and dry.  Neurological:     Mental Status: She is alert.  Psychiatric:        Mood and Affect: Mood normal.        Thought Content: Thought content normal.        Judgment: Judgment normal.     Raynelle Fanning, CMA present for exam  Assessment/Plan:   1. Abnormal uterine bleeding (AUB) (Primary) - Pregnancy, urine; negative - Levonorgestrel-Ethinyl Estrad (TYBLUME) 0.1-20 MG-MCG CHEW; Chew 1 tablet by mouth daily.  Dispense: 84 tablet; Refill: 0  2. Oral contraception initiation Open to starting OCPs to regulate and help with anemia. Risks and benefits reviewed via [place] interpreter. - Levonorgestrel-Ethinyl Estrad (TYBLUME) 0.1-20 MG-MCG CHEW; Chew 1 tablet by mouth daily.  Dispense: 84 tablet; Refill: 0     Return in about 3 months (around 02/27/2024) for Annual and med follow up.  Jesusita Jocelyn B WHNP-BC  9:30 AM 11/29/2023

## 2024-02-25 ENCOUNTER — Other Ambulatory Visit: Payer: Self-pay | Admitting: Radiology

## 2024-02-25 DIAGNOSIS — N939 Abnormal uterine and vaginal bleeding, unspecified: Secondary | ICD-10-CM

## 2024-02-25 DIAGNOSIS — Z30011 Encounter for initial prescription of contraceptive pills: Secondary | ICD-10-CM

## 2024-02-25 NOTE — Telephone Encounter (Signed)
 Med refill request: [facility] Last OV: 11/29/23 Next AEX: 03/18/24 Last MMG (if hormonal med) none Refill authorized: [facility] Please approve or deny as appropriate.

## 2024-03-18 ENCOUNTER — Ambulatory Visit (INDEPENDENT_AMBULATORY_CARE_PROVIDER_SITE_OTHER): Admitting: Radiology

## 2024-03-18 ENCOUNTER — Encounter: Payer: Self-pay | Admitting: Radiology

## 2024-03-18 ENCOUNTER — Other Ambulatory Visit (HOSPITAL_COMMUNITY)
Admission: RE | Admit: 2024-03-18 | Discharge: 2024-03-18 | Disposition: A | Discharge status: Home / Self Care | Source: Ambulatory Visit | Attending: Radiology | Admitting: Radiology

## 2024-03-18 VITALS — BP 116/80 | HR 74 | Ht 58.5 in | Wt 140.4 lb

## 2024-03-18 DIAGNOSIS — D500 Iron deficiency anemia secondary to blood loss (chronic): Secondary | ICD-10-CM

## 2024-03-18 DIAGNOSIS — Z1231 Encounter for screening mammogram for malignant neoplasm of breast: Secondary | ICD-10-CM

## 2024-03-18 DIAGNOSIS — Z1331 Encounter for screening for depression: Secondary | ICD-10-CM

## 2024-03-18 DIAGNOSIS — Z01419 Encounter for gynecological examination (general) (routine) without abnormal findings: Secondary | ICD-10-CM | POA: Insufficient documentation

## 2024-03-18 NOTE — Patient Instructions (Signed)
 Preventive Care 16-44 Years Old, Female  Preventive care refers to lifestyle choices and visits with your health care provider that can promote health and wellness. Preventive care visits are also called wellness exams.  What can I expect for my preventive care visit?  Counseling  Your health care provider may ask you questions about your:  Medical history, including:  Past medical problems.  Family medical history.  Pregnancy history.  Current health, including:  Menstrual cycle.  Method of birth control.  Emotional well-being.  Home life and relationship well-being.  Sexual activity and sexual health.  Lifestyle, including:  Alcohol, nicotine or tobacco, and drug use.  Access to firearms.  Diet, exercise, and sleep habits.  Work and work Astronomer.  Sunscreen use.  Safety issues such as seatbelt and bike helmet use.  Physical exam  Your health care provider will check your:  Height and weight. These may be used to calculate your BMI (body mass index). BMI is a measurement that tells if you are at a healthy weight.  Waist circumference. This measures the distance around your waistline. This measurement also tells if you are at a healthy weight and may help predict your risk of certain diseases, such as type 2 diabetes and high blood pressure.  Heart rate and blood pressure.  Body temperature.  Skin for abnormal spots.  What immunizations do I need?    Vaccines are usually given at various ages, according to a schedule. Your health care provider will recommend vaccines for you based on your age, medical history, and lifestyle or other factors, such as travel or where you work.  What tests do I need?  Screening  Your health care provider may recommend screening tests for certain conditions. This may include:  Lipid and cholesterol levels.  Diabetes screening. This is done by checking your blood sugar (glucose) after you have not eaten for a while (fasting).  Pelvic exam and Pap test.  Hepatitis B test.  Hepatitis C  test.  HIV (human immunodeficiency virus) test.  STI (sexually transmitted infection) testing, if you are at risk.  Lung cancer screening.  Colorectal cancer screening.  Mammogram. Talk with your health care provider about when you should start having regular mammograms. This may depend on whether you have a family history of breast cancer.  BRCA-related cancer screening. This may be done if you have a family history of breast, ovarian, tubal, or peritoneal cancers.  Bone density scan. This is done to screen for osteoporosis.  Talk with your health care provider about your test results, treatment options, and if necessary, the need for more tests.  Follow these instructions at home:  Eating and drinking    Eat a diet that includes fresh fruits and vegetables, whole grains, lean protein, and low-fat dairy products.  Take vitamin and mineral supplements as recommended by your health care provider.  Do not drink alcohol if:  Your health care provider tells you not to drink.  You are pregnant, may be pregnant, or are planning to become pregnant.  If you drink alcohol:  Limit how much you have to 0-1 drink a day.  Know how much alcohol is in your drink. In the [place] one drink equals one 12 oz bottle of beer (355 mL), one 5 oz glass of wine (148 mL), or one 1 oz glass of hard liquor (44 mL).  Lifestyle  Brush your teeth every morning and night with fluoride toothpaste. Floss one time each day.  Exercise for at least  30 minutes 5 or more days each week.  Do not use any products that contain nicotine or tobacco. These products include cigarettes, chewing tobacco, and vaping devices, such as e-cigarettes. If you need help quitting, ask your health care provider.  Do not use drugs.  If you are sexually active, practice safe sex. Use a condom or other form of protection to prevent STIs.  If you do not wish to become pregnant, use a form of birth control. If you plan to become pregnant, see your health care provider for a  prepregnancy visit.  Take aspirin only as told by your health care provider. Make sure that you understand how much to take and what form to take. Work with your health care provider to find out whether it is safe and beneficial for you to take aspirin daily.  Find healthy ways to manage stress, such as:  Meditation, yoga, or listening to music.  Journaling.  Talking to a trusted person.  Spending time with friends and family.  Minimize exposure to UV radiation to reduce your risk of skin cancer.  Safety  Always wear your seat belt while driving or riding in a vehicle.  Do not drive:  If you have been drinking alcohol. Do not ride with someone who has been drinking.  When you are tired or distracted.  While texting.  If you have been using any mind-altering substances or drugs.  Wear a helmet and other protective equipment during sports activities.  If you have firearms in your house, make sure you follow all gun safety procedures.  Seek help if you have been physically or sexually abused.  What's next?  Visit your health care provider once a year for an annual wellness visit.  Ask your health care provider how often you should have your eyes and teeth checked.  Stay up to date on all vaccines.  This information is not intended to replace advice given to you by your health care provider. Make sure you discuss any questions you have with your health care provider.  Document Revised: 04/26/2021 Document Reviewed: 04/26/2021  Elsevier Patient Education  2024 [employer].

## 2024-03-18 NOTE — Progress Notes (Signed)
 Theresa Avery 1981-06-05 [id number]   History:  43 y.o. G3P3 presents for annual exam with [place] video interpreter. Was started on OCPs to help with periods and anemia, stopped after 2 days due to nausea. Otherwise doing well, no concerns.  Gynecologic History Patient's last menstrual period was 03/05/2024 (exact date). Period Cycle (Days):  (skips periods every 2 months) Period Duration (Days): 2 Period Pattern: (!) Irregular Menstrual Flow: Light Menstrual Control: Thin pad, Maxi pad Dysmenorrhea: None Contraception/Family planning: none Sexually active: yes Last Pap: 2018. Results were: normal Last mammogram: unsure   Obstetric History OB History  Gravida Para Term Preterm AB Living  3 3 3  0 0 3  SAB IAB Ectopic Multiple Live Births  0 0 0 0 3    # Outcome Date GA Lbr Len/2nd Weight Sex Type Anes PTL Lv  3 Term 10/25/17 [redacted]w[redacted]d  7 lb 1.2 oz (3.21 kg) F CS-LTranv Spinal  LIV  2 Term 07/12/11 [redacted]w[redacted]d  8 lb 0.6 oz (3.645 kg) M CS-Vac EPI  LIV  1 Term 01/22/09    M CS-LTranv EPI N LIV       03/18/2024    9:13 AM 09/24/2023   11:27 AM  Depression screen PHQ 2/9  Decreased Interest 0 0  Down, Depressed, Hopeless 0 0  PHQ - 2 Score 0 0     The following portions of the patient's history were reviewed and updated as appropriate: allergies, current medications, past family history, past medical history, past social history, past surgical history, and problem list.  Review of Systems  All other systems reviewed and are negative.   Past medical history, past surgical history, family history and social history were all reviewed and documented in the EPIC chart.  Exam:  Vitals:   03/18/24 0908  BP: 116/80  Pulse: 74  SpO2: 98%  Weight: 140 lb 6.4 oz (63.7 kg)  Height: 4' 10.5" (1.486 m)   Body mass index is 28.84 kg/m.  Physical Exam Vitals and nursing note reviewed. Exam conducted with a chaperone present.  Constitutional:      Appearance: Normal appearance. She  is normal weight.  HENT:     Head: Normocephalic and atraumatic.  Neck:     Thyroid: No thyroid mass, thyromegaly or thyroid tenderness.  Cardiovascular:     Rate and Rhythm: Regular rhythm.     Heart sounds: Normal heart sounds.  Pulmonary:     Effort: Pulmonary effort is normal.     Breath sounds: Normal breath sounds.  Chest:  Breasts:    Breasts are symmetrical.     Right: Normal. No inverted nipple, mass, nipple discharge, skin change or tenderness.     Left: Normal. No inverted nipple, mass, nipple discharge, skin change or tenderness.  Abdominal:     General: Abdomen is flat. Bowel sounds are normal.     Palpations: Abdomen is soft.  Genitourinary:    General: Normal vulva.     Vagina: Normal. No vaginal discharge, bleeding or lesions.     Cervix: Normal. No discharge or lesion.     Uterus: Normal. Not enlarged and not tender.      Adnexa: Right adnexa normal and left adnexa normal.       Right: No mass, tenderness or fullness.         Left: No mass, tenderness or fullness.    Lymphadenopathy:     Upper Body:     Right upper body: No axillary adenopathy.  Left upper body: No axillary adenopathy.  Skin:    General: Skin is warm and dry.  Neurological:     Mental Status: She is alert and oriented to person, place, and time.  Psychiatric:        Mood and Affect: Mood normal.        Thought Content: Thought content normal.        Judgment: Judgment normal.      Theresa Avery, CMA present for exam  Assessment/Plan:   1. Well woman exam with routine gynecological exam (Primary) - Cytology - PAP( [place] [facility])  2. Screening mammogram for breast cancer - MM Digital Screening; Future  3. Depression screening negative  4. Iron deficiency anemia due to chronic blood loss - CBC - Iron, TIBC and Ferritin Panel     Return in about 1 year (around 03/18/2025) for Annual.  Theresa Avery B WHNP-BC 9:30 AM 03/18/2024

## 2024-03-19 LAB — CBC
HCT: 41 % (ref 35.0–45.0)
Hemoglobin: 13.5 g/dL (ref 11.7–15.5)
MCH: 28.7 pg (ref 27.0–33.0)
MCHC: 32.9 g/dL (ref 32.0–36.0)
MCV: 87 fL (ref 80.0–100.0)
MPV: 11.9 fL (ref 7.5–12.5)
Platelets: 257 10*3/uL (ref 140–400)
RBC: 4.71 10*6/uL (ref 3.80–5.10)
RDW: 11.6 % (ref 11.0–15.0)
WBC: 4.7 10*3/uL (ref 3.8–10.8)

## 2024-03-19 LAB — IRON,TIBC AND FERRITIN PANEL
%SAT: 34 % (ref 16–45)
Ferritin: 57 ng/mL (ref 16–232)
Iron: 105 ug/dL (ref 40–190)
TIBC: 308 ug/dL (ref 250–450)

## 2024-03-23 LAB — CYTOLOGY - PAP
Comment: NEGATIVE
Diagnosis: NEGATIVE
Diagnosis: REACTIVE
High risk HPV: NEGATIVE

## 2024-09-18 ENCOUNTER — Encounter: Payer: Self-pay | Admitting: Family Medicine
